# Patient Record
Sex: Female | Born: 1980 | Race: White | Hispanic: No | Marital: Married | Smoking: Former smoker
Health system: Southern US, Community
[De-identification: ages and names within clinical notes are randomized; demographics above are authoritative.]

## PROBLEM LIST (undated history)

## (undated) DIAGNOSIS — T8859XA Other complications of anesthesia, initial encounter: Secondary | ICD-10-CM

## (undated) DIAGNOSIS — N926 Irregular menstruation, unspecified: Secondary | ICD-10-CM

## (undated) DIAGNOSIS — Z9889 Other specified postprocedural states: Secondary | ICD-10-CM

## (undated) DIAGNOSIS — R112 Nausea with vomiting, unspecified: Secondary | ICD-10-CM

## (undated) DIAGNOSIS — T4145XA Adverse effect of unspecified anesthetic, initial encounter: Secondary | ICD-10-CM

## (undated) DIAGNOSIS — N6099 Unspecified benign mammary dysplasia of unspecified breast: Secondary | ICD-10-CM

## (undated) HISTORY — DX: Irregular menstruation, unspecified: N92.6

## (undated) HISTORY — DX: Unspecified benign mammary dysplasia of unspecified breast: N60.99

## (undated) HISTORY — PX: TUBAL LIGATION: SHX77

---

## 2007-03-21 ENCOUNTER — Ambulatory Visit: Payer: Self-pay | Admitting: Unknown Physician Specialty

## 2007-04-06 ENCOUNTER — Observation Stay: Payer: Self-pay

## 2007-04-19 ENCOUNTER — Observation Stay: Payer: Self-pay | Admitting: Obstetrics and Gynecology

## 2007-04-24 ENCOUNTER — Inpatient Hospital Stay: Payer: Self-pay | Admitting: Obstetrics and Gynecology

## 2007-05-01 ENCOUNTER — Inpatient Hospital Stay: Payer: Self-pay | Admitting: Obstetrics & Gynecology

## 2010-11-24 ENCOUNTER — Inpatient Hospital Stay: Payer: Self-pay | Admitting: Obstetrics and Gynecology

## 2017-03-26 ENCOUNTER — Ambulatory Visit (INDEPENDENT_AMBULATORY_CARE_PROVIDER_SITE_OTHER): Payer: 59 | Admitting: Certified Nurse Midwife

## 2017-03-26 ENCOUNTER — Encounter: Payer: Self-pay | Admitting: Certified Nurse Midwife

## 2017-03-26 VITALS — BP 116/74 | HR 76 | Ht 63.0 in | Wt 148.7 lb

## 2017-03-26 DIAGNOSIS — N939 Abnormal uterine and vaginal bleeding, unspecified: Secondary | ICD-10-CM | POA: Diagnosis not present

## 2017-03-26 DIAGNOSIS — N898 Other specified noninflammatory disorders of vagina: Secondary | ICD-10-CM

## 2017-03-26 DIAGNOSIS — N921 Excessive and frequent menstruation with irregular cycle: Secondary | ICD-10-CM | POA: Diagnosis not present

## 2017-03-26 MED ORDER — FLUCONAZOLE 150 MG PO TABS: 150.0000 mg | ORAL_TABLET | Freq: Once | ORAL | 0 refills | Status: AC

## 2017-03-26 NOTE — Patient Instructions (Addendum)
Abnormal Uterine Bleeding Abnormal uterine bleeding can affect women at various stages in life, including teenagers, women in their reproductive years, pregnant women, and women who have reached menopause. Several kinds of uterine bleeding are considered abnormal, including:  Bleeding or spotting between periods.  Bleeding after sexual intercourse.  Bleeding that is heavier or more than normal.  Periods that last longer than usual.  Bleeding after menopause.  Many cases of abnormal uterine bleeding are minor and simple to treat, while others are more serious. Any type of abnormal bleeding should be evaluated by your health care provider. Treatment will depend on the cause of the bleeding. Follow these instructions at home: Monitor your condition for any changes. The following actions may help to alleviate any discomfort you are experiencing:  Avoid the use of tampons and douches as directed by your health care provider.  Change your pads frequently.  You should get regular pelvic exams and Pap tests. Keep all follow-up appointments for diagnostic tests as directed by your health care provider. Contact a health care provider if:  Your bleeding lasts more than 1 week.  You feel dizzy at times. Get help right away if:  You pass out.  You are changing pads every 15 to 30 minutes.  You have abdominal pain.  You have a fever.  You become sweaty or weak.  You are passing large blood clots from the vagina.  You start to feel nauseous and vomit. This information is not intended to replace advice given to you by your health care provider. Make sure you discuss any questions you have with your health care provider. Document Released: 07/23/2005 Document Revised: 01/04/2016 Document Reviewed: 02/19/2013 Elsevier Interactive Patient Education  2017 [employer]. Levonorgestrel intrauterine device (IUD) What is this medicine? LEVONORGESTREL IUD (LEE voe nor jes trel) is a  contraceptive (birth control) device. The device is placed inside the uterus by a healthcare professional. It is used to prevent pregnancy. This device can also be used to treat heavy bleeding that occurs during your period. This medicine may be used for other purposes; ask your health care provider or pharmacist if you have questions. COMMON BRAND NAME(S): Cameron Ali What should I tell my health care provider before I take this medicine? They need to know if you have any of these conditions: -abnormal Pap smear -cancer of the breast, uterus, or cervix -diabetes -endometritis -genital or pelvic infection now or in the past -have more than one sexual partner or your partner has more than one partner -heart disease -history of an ectopic or tubal pregnancy -immune system problems -IUD in place -liver disease or tumor -problems with blood clots or take blood-thinners -seizures -use intravenous drugs -uterus of unusual shape -vaginal bleeding that has not been explained -an unusual or allergic reaction to levonorgestrel, other hormones, silicone, or polyethylene, medicines, foods, dyes, or preservatives -pregnant or trying to get pregnant -breast-feeding How should I use this medicine? This device is placed inside the uterus by a health care professional. Talk to your pediatrician regarding the use of this medicine in children. Special care may be needed. Overdosage: If you think you have taken too much of this medicine contact a poison control center or emergency room at once. NOTE: This medicine is only for you. Do not share this medicine with others. What if I miss a dose? This does not apply. Depending on the brand of device you have inserted, the device will need to be replaced every 3 to 5  years if you wish to continue using this type of birth control. What may interact with this medicine? Do not take this medicine with any of the following  medications: -amprenavir -bosentan -fosamprenavir This medicine may also interact with the following medications: -aprepitant -armodafinil -barbiturate medicines for inducing sleep or treating seizures -bexarotene -boceprevir -griseofulvin -medicines to treat seizures like carbamazepine, ethotoin, felbamate, oxcarbazepine, phenytoin, topiramate -modafinil -pioglitazone -rifabutin -rifampin -rifapentine -some medicines to treat HIV infection like atazanavir, efavirenz, indinavir, lopinavir, nelfinavir, tipranavir, ritonavir -St. John's wort -warfarin This list may not describe all possible interactions. Give your health care provider a list of all the medicines, herbs, non-prescription drugs, or dietary supplements you use. Also tell them if you smoke, drink alcohol, or use illegal drugs. Some items may interact with your medicine. What should I watch for while using this medicine? Visit your doctor or health care professional for regular check ups. See your doctor if you or your partner has sexual contact with others, becomes HIV positive, or gets a sexual transmitted disease. This product does not protect you against HIV infection (AIDS) or other sexually transmitted diseases. You can check the placement of the IUD yourself by reaching up to the top of your vagina with clean fingers to feel the threads. Do not pull on the threads. It is a good habit to check placement after each menstrual period. Call your doctor right away if you feel more of the IUD than just the threads or if you cannot feel the threads at all. The IUD may come out by itself. You may become pregnant if the device comes out. If you notice that the IUD has come out use a backup birth control method like condoms and call your health care provider. Using tampons will not change the position of the IUD and are okay to use during your period. This IUD can be safely scanned with magnetic resonance imaging (MRI) only under  specific conditions. Before you have an MRI, tell your healthcare provider that you have an IUD in place, and which type of IUD you have in place. What side effects may I notice from receiving this medicine? Side effects that you should report to your doctor or health care professional as soon as possible: -allergic reactions like skin rash, itching or hives, swelling of the face, lips, or tongue -fever, flu-like symptoms -genital sores -high blood pressure -no menstrual period for 6 weeks during use -pain, swelling, warmth in the leg -pelvic pain or tenderness -severe or sudden headache -signs of pregnancy -stomach cramping -sudden shortness of breath -trouble with balance, talking, or walking -unusual vaginal bleeding, discharge -yellowing of the eyes or skin Side effects that usually do not require medical attention (report to your doctor or health care professional if they continue or are bothersome): -acne -breast pain -change in sex drive or performance -changes in weight -cramping, dizziness, or faintness while the device is being inserted -headache -irregular menstrual bleeding within first 3 to 6 months of use -nausea This list may not describe all possible side effects. Call your doctor for medical advice about side effects. You may report side effects to FDA at 1-800-FDA-1088. Where should I keep my medicine? This does not apply. NOTE: This sheet is a summary. It may not cover all possible information. If you have questions about this medicine, talk to your doctor, pharmacist, or health care provider.  2018 Elsevier/Gold Standard (2016-05-04 14:14:56) Norethindrone tablets (contraception) What is this medicine? NORETHINDRONE (nor eth IN drone) is an oral  contraceptive. The product contains a female hormone known as a progestin. It is used to prevent pregnancy. This medicine may be used for other purposes; ask your health care provider or pharmacist if you have  questions. COMMON BRAND NAME(S): Camila, Deblitane 28-Day, Errin, Heather, Qulin, Jolivette, [place], Nor-QD, Nora-BE, Norlyroc, Ortho Micronor, [employer] 28-Day What should I tell my health care provider before I take this medicine? They need to know if you have any of these conditions: -blood vessel disease or blood clots -breast, cervical, or vaginal cancer -diabetes -heart disease -kidney disease -liver disease -mental depression -migraine -seizures -stroke -vaginal bleeding -an unusual or allergic reaction to norethindrone, other medicines, foods, dyes, or preservatives -pregnant or trying to get pregnant -breast-feeding How should I use this medicine? Take this medicine by mouth with a glass of water. You may take it with or without food. Follow the directions on the prescription label. Take this medicine at the same time each day and in the order directed on the package. Do not take your medicine more often than directed. Contact your pediatrician regarding the use of this medicine in children. Special care may be needed. This medicine has been used in female children who have started having menstrual periods. A patient package insert for the product will be given with each prescription and refill. Read this sheet carefully each time. The sheet may change frequently. Overdosage: If you think you have taken too much of this medicine contact a poison control center or emergency room at once. NOTE: This medicine is only for you. Do not share this medicine with others. What if I miss a dose? Try not to miss a dose. Every time you miss a dose or take a dose late your chance of pregnancy increases. When 1 pill is missed (even if only 3 hours late), take the missed pill as soon as possible and continue taking a pill each day at the regular time (use a back up method of birth control for the next 48 hours). If more than 1 dose is missed, use an additional birth control method for the rest of  your pill pack until menses occurs. Contact your health care professional if more than 1 dose has been missed. What may interact with this medicine? Do not take this medicine with any of the following medications: -amprenavir or fosamprenavir -bosentan This medicine may also interact with the following medications: -antibiotics or medicines for infections, especially rifampin, rifabutin, rifapentine, and griseofulvin, and possibly penicillins or tetracyclines -aprepitant -barbiturate medicines, such as phenobarbital -carbamazepine -felbamate -modafinil -oxcarbazepine -phenytoin -ritonavir or other medicines for HIV infection or AIDS -St. John's wort -topiramate This list may not describe all possible interactions. Give your health care provider a list of all the medicines, herbs, non-prescription drugs, or dietary supplements you use. Also tell them if you smoke, drink alcohol, or use illegal drugs. Some items may interact with your medicine. What should I watch for while using this medicine? Visit your doctor or health care professional for regular checks on your progress. You will need a regular breast and pelvic exam and Pap smear while on this medicine. Use an additional method of birth control during the first cycle that you take these tablets. If you have any reason to think you are pregnant, stop taking this medicine right away and contact your doctor or health care professional. If you are taking this medicine for hormone related problems, it may take several cycles of use to see improvement in your condition. This medicine  does not protect you against HIV infection (AIDS) or any other sexually transmitted diseases. What side effects may I notice from receiving this medicine? Side effects that you should report to your doctor or health care professional as soon as possible: -breast tenderness or discharge -pain in the abdomen, chest, groin or leg -severe headache -skin rash,  itching, or hives -sudden shortness of breath -unusually weak or tired -vision or speech problems -yellowing of skin or eyes Side effects that usually do not require medical attention (report to your doctor or health care professional if they continue or are bothersome): -changes in sexual desire -change in menstrual flow -facial hair growth -fluid retention and swelling -headache -irritability -nausea -weight gain or loss This list may not describe all possible side effects. Call your doctor for medical advice about side effects. You may report side effects to FDA at 1-800-FDA-1088. Where should I keep my medicine? Keep out of the reach of children. Store at room temperature between 15 and 30 degrees C (59 and 86 degrees F). Throw away any unused medicine after the expiration date. NOTE: This sheet is a summary. It may not cover all possible information. If you have questions about this medicine, talk to your doctor, pharmacist, or health care provider.  2018 Elsevier/Gold Standard (2012-04-11 16:41:35)  Medroxyprogesterone injection [Contraceptive] What is this medicine? MEDROXYPROGESTERONE (me DROX ee proe JES te rone) contraceptive injections prevent pregnancy. They provide effective birth control for 3 months. Depo-subQ Provera 104 is also used for treating pain related to endometriosis. This medicine may be used for other purposes; ask your health care provider or pharmacist if you have questions. COMMON BRAND NAME(S): Depo-Provera, Depo-subQ Provera 104 What should I tell my health care provider before I take this medicine? They need to know if you have any of these conditions: -frequently drink alcohol -asthma -blood vessel disease or a history of a blood clot in the lungs or legs -bone disease such as osteoporosis -breast cancer -diabetes -eating disorder (anorexia nervosa or bulimia) -high blood pressure -HIV infection or AIDS -kidney disease -liver disease -mental  depression -migraine -seizures (convulsions) -stroke -tobacco smoker -vaginal bleeding -an unusual or allergic reaction to medroxyprogesterone, other hormones, medicines, foods, dyes, or preservatives -pregnant or trying to get pregnant -breast-feeding How should I use this medicine? Depo-Provera Contraceptive injection is given into a muscle. Depo-subQ Provera 104 injection is given under the skin. These injections are given by a health care professional. You must not be pregnant before getting an injection. The injection is usually given during the first 5 days after the start of a menstrual period or 6 weeks after delivery of a baby. Talk to your pediatrician regarding the use of this medicine in children. Special care may be needed. These injections have been used in female children who have started having menstrual periods. Overdosage: If you think you have taken too much of this medicine contact a poison control center or emergency room at once. NOTE: This medicine is only for you. Do not share this medicine with others. What if I miss a dose? Try not to miss a dose. You must get an injection once every 3 months to maintain birth control. If you cannot keep an appointment, call and reschedule it. If you wait longer than 13 weeks between Depo-Provera contraceptive injections or longer than 14 weeks between Depo-subQ Provera 104 injections, you could get pregnant. Use another method for birth control if you miss your appointment. You may also need a pregnancy test before  receiving another injection. What may interact with this medicine? Do not take this medicine with any of the following medications: -bosentan This medicine may also interact with the following medications: -aminoglutethimide -antibiotics or medicines for infections, especially rifampin, rifabutin, rifapentine, and griseofulvin -aprepitant -barbiturate medicines such as phenobarbital or  primidone -bexarotene -carbamazepine -medicines for seizures like ethotoin, felbamate, oxcarbazepine, phenytoin, topiramate -modafinil -St. John's wort This list may not describe all possible interactions. Give your health care provider a list of all the medicines, herbs, non-prescription drugs, or dietary supplements you use. Also tell them if you smoke, drink alcohol, or use illegal drugs. Some items may interact with your medicine. What should I watch for while using this medicine? This drug does not protect you against HIV infection (AIDS) or other sexually transmitted diseases. Use of this product may cause you to lose calcium from your bones. Loss of calcium may cause weak bones (osteoporosis). Only use this product for more than 2 years if other forms of birth control are not right for you. The longer you use this product for birth control the more likely you will be at risk for weak bones. Ask your health care professional how you can keep strong bones. You may have a change in bleeding pattern or irregular periods. Many females stop having periods while taking this drug. If you have received your injections on time, your chance of being pregnant is very low. If you think you may be pregnant, see your health care professional as soon as possible. Tell your health care professional if you want to get pregnant within the next year. The effect of this medicine may last a long time after you get your last injection. What side effects may I notice from receiving this medicine? Side effects that you should report to your doctor or health care professional as soon as possible: -allergic reactions like skin rash, itching or hives, swelling of the face, lips, or tongue -breast tenderness or discharge -breathing problems -changes in vision -depression -feeling faint or lightheaded, falls -fever -pain in the abdomen, chest, groin, or leg -problems with balance, talking, walking -unusually weak  or tired -yellowing of the eyes or skin Side effects that usually do not require medical attention (report to your doctor or health care professional if they continue or are bothersome): -acne -fluid retention and swelling -headache -irregular periods, spotting, or absent periods -temporary pain, itching, or skin reaction at site where injected -weight gain This list may not describe all possible side effects. Call your doctor for medical advice about side effects. You may report side effects to FDA at 1-800-FDA-1088. Where should I keep my medicine? This does not apply. The injection will be given to you by a health care professional. NOTE: This sheet is a summary. It may not cover all possible information. If you have questions about this medicine, talk to your doctor, pharmacist, or health care provider.  2018 Elsevier/Gold Standard (2008-08-13 18:37:56) Fluconazole tablets What is this medicine? FLUCONAZOLE (floo KON na zole) is an antifungal medicine. It is used to treat certain kinds of fungal or yeast infections. This medicine may be used for other purposes; ask your health care provider or pharmacist if you have questions. COMMON BRAND NAME(S): Diflucan What should I tell my health care provider before I take this medicine? They need to know if you have any of these conditions: -history of irregular heart beat -kidney disease -an unusual or allergic reaction to fluconazole, other azole antifungals, medicines, foods, dyes, or preservatives -  pregnant or trying to get pregnant -breast-feeding How should I use this medicine? Take this medicine by mouth. Follow the directions on the prescription label. Do not take your medicine more often than directed. Talk to your pediatrician regarding the use of this medicine in children. Special care may be needed. This medicine has been used in children as young as 21 months of age. Overdosage: If you think you have taken too much of this  medicine contact a poison control center or emergency room at once. NOTE: This medicine is only for you. Do not share this medicine with others. What if I miss a dose? If you miss a dose, take it as soon as you can. If it is almost time for your next dose, take only that dose. Do not take double or extra doses. What may interact with this medicine? Do not take this medicine with any of the following medications: -astemizole -certain medicines for irregular heart beat like dofetilide, dronedarone, quinidine -cisapride -erythromycin -lomitapide -other medicines that prolong the QT interval (cause an abnormal heart rhythm) -pimozide -terfenadine -thioridazine -tolvaptan -ziprasidone This medicine may also interact with the following medications: -antiviral medicines for HIV or AIDS -birth control pills -certain antibiotics like rifabutin, rifampin -certain medicines for blood pressure like amlodipine, isradipine, felodipine, hydrochlorothiazide, losartan, nifedipine -certain medicines for cancer like cyclophosphamide, vinblastine, vincristine -certain medicines for cholesterol like atorvastatin, lovastatin, fluvastatin, simvastatin -certain medicines for depression, anxiety, or psychotic disturbances like amitriptyline, midazolam, nortriptyline, triazolam -certain medicines for diabetes like glipizide, glyburide, tolbutamide -certain medicines for pain like alfentanil, fentanyl, methadone -certain medicines for seizures like carbamazepine, phenytoin -certain medicines that treat or prevent blood clots like warfarin -halofantrine -medicines that lower your chance of fighting infection like cyclosporine, prednisone, tacrolimus -NSAIDS, medicines for pain and inflammation, like celecoxib, diclofenac, flurbiprofen, ibuprofen, meloxicam, naproxen -other medicines for fungal infections -sirolimus -theophylline -tofacitinib This list may not describe all possible interactions. Give your  health care provider a list of all the medicines, herbs, non-prescription drugs, or dietary supplements you use. Also tell them if you smoke, drink alcohol, or use illegal drugs. Some items may interact with your medicine. What should I watch for while using this medicine? Visit your doctor or health care professional for regular checkups. If you are taking this medicine for a long time you may need blood work. Tell your doctor if your symptoms do not improve. Some fungal infections need many weeks or months of treatment to cure. Alcohol can increase possible damage to your liver. Avoid alcoholic drinks. If you have a vaginal infection, do not have sex until you have finished your treatment. You can wear a sanitary napkin. Do not use tampons. Wear freshly washed cotton, not synthetic, panties. What side effects may I notice from receiving this medicine? Side effects that you should report to your doctor or health care professional as soon as possible: -allergic reactions like skin rash or itching, hives, swelling of the lips, mouth, tongue, or throat -dark urine -feeling dizzy or faint -irregular heartbeat or chest pain -redness, blistering, peeling or loosening of the skin, including inside the mouth -trouble breathing -unusual bruising or bleeding -vomiting -yellowing of the eyes or skin Side effects that usually do not require medical attention (report to your doctor or health care professional if they continue or are bothersome): -changes in how food tastes -diarrhea -headache -stomach upset or nausea This list may not describe all possible side effects. Call your doctor for medical advice about side effects. You may report  side effects to FDA at 1-800-FDA-1088. Where should I keep my medicine? Keep out of the reach of children. Store at room temperature below 30 degrees C (86 degrees F). Throw away any medicine after the expiration date. NOTE: This sheet is a summary. It may not cover  all possible information. If you have questions about this medicine, talk to your doctor, pharmacist, or health care provider.  2018 Elsevier/Gold Standard (2013-02-28 19:37:38)  Intrauterine Device Insertion An intrauterine device (IUD) is a medical device that gets inserted into the uterus to prevent pregnancy. It is a small, T-shaped device that has one or two nylon strings hanging down from it. The strings hang out of the lower part of the uterus (cervix) to allow for future IUD removal. There are two types of IUDs available:  Copper IUD. This type of IUD has copper wire wrapped around it. Copper makes the uterus and fallopian tubes produce a fluid that kills sperm. A copper IUD may last up to 10 years.  Hormone IUD. This type of IUD is made of plastic and contains the hormone progestin (synthetic progesterone). The hormone thickens mucus in the cervix and prevents sperm from entering the uterus. It also thins the uterine lining to prevent implantation of a fertilized egg. The hormone can weaken or kill the sperm that get into the uterus. A hormone IUD may last 3-5 years.  Tell a health care provider about:  Any allergies you have.  All medicines you are taking, including vitamins, herbs, eye drops, creams, and over-the-counter medicines.  Any problems you or family members have had with anesthetic medicines.  Any blood disorders you have.  Any surgeries you have had.  Any medical conditions you have, including any STIs (sexually transmitted infections) you may have.  Whether you are pregnant or may be pregnant. What are the risks? Generally, this is a safe procedure. However, problems may occur, including:  Infection.  Bleeding.  Allergic reactions to medicines.  Accidental puncture (perforation) of the uterus, or damage to other structures or organs.  Accidental placement of the IUD either in the muscle layer of the uterus (myometrium) or outside the uterus.  The IUD  falling out of the uterus (expulsion). This is more common among women who have recently had a child.  Pregnancy that happens in the fallopian tube (ectopic pregnancy).  Infection of the uterus and fallopian tubes (pelvic inflammatory disease).  What happens before the procedure?  Schedule the IUD insertion for when you will have your menstrual period or right after, to make sure you are not pregnant. Placement of the IUD is better tolerated shortly after a menstrual cycle.  Follow instructions from your health care provider about eating or drinking restrictions.  Ask your health care provider about changing or stopping your regular medicines. This is especially important if you are taking diabetes medicines or blood thinners.  You may get a pain reliever to take before the procedure.  You may have tests for: ? Pregnancy. A pregnancy test involves having a urine sample taken. ? STIs. Placing an IUD in someone who has an STI can make the infection worse. ? Cervical cancer. You may have a Pap test to check for this type of cancer. This means collecting cells from your cervix to be examined under a microscope.  You may have a physical exam to determine the size and position of your uterus. The procedure may vary among health care providers and hospitals. What happens during the procedure?  A tool (  speculum) will be placed in your vagina and widened so that your health care provider can see your cervix.  Medicine may be applied to your cervix to help lower your risk of infection (antiseptic medicine).  You may be given an anesthetic medicine to numb each side of your cervix (intracervical block or paracervical block). This medicine is usually given by an injection into the cervix.  A tool (uterine sound) will be inserted into your uterus to determine the length of your uterus and the direction that your uterus may be tilted.  A slim instrument or tube (IUD inserter) that holds the IUD  will be inserted into your vagina, through your cervical canal, and into your uterus.  The IUD will be placed in the uterus, and the IUD inserter will be removed.  The strings that are attached to the IUD will be trimmed so that they lie just below the cervix. The procedure may vary among health care providers and hospitals. What happens after the procedure?  You may have bleeding after the procedure. This is normal. It varies from light bleeding (spotting) for a few days to menstrual-like bleeding.  You may have cramping and pain.  You may feel dizzy or light-headed.  You may have lower back pain. Summary  An intrauterine device (IUD) is a small, T-shaped device that has one or two nylon strings hanging down from it.  Two types of IUDs are available. You may have a copper IUD or a hormone IUD.  Schedule the IUD insertion for when you will have your menstrual period or right after, to make sure you are not pregnant. Placement of the IUD is better tolerated shortly after a menstrual cycle.  You may have bleeding after the procedure. This is normal. It varies from light spotting for a few days to menstrual-like bleeding. This information is not intended to replace advice given to you by your health care provider. Make sure you discuss any questions you have with your health care provider. Document Released: 03/21/2011 Document Revised: 06/13/2016 Document Reviewed: 06/13/2016 Elsevier Interactive Patient Education  2017 [employer].

## 2017-03-29 LAB — CANDIDA 6 SPECIES PROFILE, NAA
CANDIDA KRUSEI, NAA: NEGATIVE
Candida albicans, NAA: NEGATIVE
Candida glabrata, NAA: NEGATIVE
Candida lusitaniae, NAA: NEGATIVE
Candida parapsilosis, NAA: NEGATIVE
Candida tropicalis, NAA: NEGATIVE

## 2017-03-29 LAB — BACTERIAL VAGINOSIS, NAA

## 2017-03-31 NOTE — Progress Notes (Signed)
GYN ENCOUNTER NOTE  Subjective:       Maria Nixon is a 36 y.o. G53P2012 female here for gynecologic evaluation of irregular menses and recurrent yeast infections.   States her period is "ridiculous". Last for seven (7) full days then dark bleeding with clots for the following week.   Last annual exam with [place] and lab work in October 2017 at [facility].   Denies difficulty breathing or respiratory distress, chest pain, abdominal pain, dysuria, and leg pain or swelling.    Gynecologic History  Patient's last menstrual period was 03/03/2017 (exact date).  Contraception: none  Last Pap: 2016. Results were: normal  Menstrual History  Period Duration (Days): seven (7) to 14 Period Pattern: (!) Irregular Menstrual Flow: Heavy Menstrual Control: Maxi pad, Tampon Menstrual Control Change Freq (Hours): three (3) Dysmenorrhea: (!) Mild Dysmenorrhea Symptoms: Cramping  Obstetric History  OB History  Gravida Para Term Preterm AB Living  3 2 2   1 2   SAB TAB Ectopic Multiple Live Births  1       2    # Outcome Date GA Lbr Len/2nd Weight Sex Delivery Anes PTL Lv  3 Term 11/24/10 [redacted]w[redacted]d 9 lb 8 oz (4.309 kg) M CS-Unspec   LIV  2 Term 04/25/07 [id number] lb 1.9 oz (4.137 kg) F Vag-Spont   LIV  1 SAB 2001        ND      Past Medical History:  Diagnosis Date  . Irregular periods     Past Surgical History:  Procedure Laterality Date  . NONE      No current outpatient prescriptions on file prior to visit.   No current facility-administered medications on file prior to visit.     Not on File  Social History   Social History  . Marital status: Married    Spouse name: N/A  . Number of children: N/A  . Years of education: N/A   Occupational History  . Not on file.   Social History Main Topics  . Smoking status: Former SmResearch scientist (life sciences). Smokeless tobacco: Never Used  . Alcohol use Yes     Comment: SOCIAL  . Drug use: No  . Sexual activity: Yes    Partners: Male   Birth control/ protection: None   Other Topics Concern  . Not on file   Social History Narrative  . No narrative on file    Family History  Problem Relation Age of Onset  . Rashes / Skin problems Mother   . Diabetes Maternal Grandmother   . Heart failure Maternal Grandfather   . Stroke Paternal Grandmother     The following portions of the patient's history were reviewed and updated as appropriate: allergies, current medications, past family history, past medical history, past social history, past surgical history and problem list.  Review of Systems  Review of Systems - Negative except as noted above.  History obtained from the patient.   Objective:   BP 116/74   Pulse 76   Ht 5' 3"  (1.6 m)   Wt 148 lb 11.2 oz (67.4 kg)   LMP 03/03/2017 (Exact Date)   BMI 26.34 kg/m   CONSTITUTIONAL: Well-developed, well-nourished female in no acute distress.   HENT:  Normocephalic, atraumatic.   NECK: Normal range of motion, supple, no masses.     SKIN: Skin is warm and dry. No rash noted. Not diaphoretic. No erythema. No pallor.  NELincolnAlert and oriented to person, place, and time.  PSYCHIATRIC: Normal mood and affect. Normal behavior. Normal judgment and thought content.  CARDIOVASCULAR:Not Examined  RESPIRATORY: Not Examined  BREASTS: Not Examined  ABDOMEN: Soft, non distended; Non tender.  No Organomegaly.  PELVIC:  External Genitalia: Normal  Vagina: Normal  Cervix: Normal  Uterus: Normal size, shape,consistency, mobile  Adnexa: Normal   MUSCULOSKELETAL: Normal range of motion. No tenderness.  No cyanosis, clubbing, or edema.  Assessment:   1. Abnormal uterine bleeding  - Candida 6 Species Profile, NAA - Bacterial Vaginosis, NAA  2. Menorrhagia with irregular cycle  3. Vaginal discharge  - Candida 6 Species Profile, NAA - Bacterial Vaginosis, NAA   Plan:   NuSwab collected. Will contact patient via MyChart with results.   Encouraged the use  of coconut oil, lavender oil, and tree tree oil to help with vaginal irritation.   Discussed use of progesterone only agents to regulate menses.   Will sign MMR for records from [facility].   Reviewed red flag symptoms and when to call.   RTC as needed.    Diona Fanti, CNM

## 2017-04-01 ENCOUNTER — Encounter: Payer: Self-pay | Admitting: Certified Nurse Midwife

## 2017-04-05 ENCOUNTER — Telehealth: Payer: Self-pay | Admitting: Certified Nurse Midwife

## 2017-04-05 DIAGNOSIS — N761 Subacute and chronic vaginitis: Secondary | ICD-10-CM

## 2017-04-05 MED ORDER — NYSTATIN-TRIAMCINOLONE 100000-0.1 UNIT/GM-% EX CREA: 1.0000 "application " | TOPICAL_CREAM | Freq: Two times a day (BID) | CUTANEOUS | 0 refills | Status: DC

## 2017-04-05 NOTE — Telephone Encounter (Signed)
Patient is still having itching and irritation, pressure and urgency she finished the 2 doses of medication and also started her period - she is feeling miserable   Is there anything you can do before this long weekend??   Please callok

## 2017-04-05 NOTE — Telephone Encounter (Signed)
Pt calls and states that she is still having vaginal irritation. Pt has completed diflucan treatment as well as tea tree oil. However is still irritated. Per JML sent rx for mycolog pt have verbal understanding.

## 2017-06-13 ENCOUNTER — Encounter: Payer: Self-pay | Admitting: Certified Nurse Midwife

## 2017-06-13 ENCOUNTER — Other Ambulatory Visit: Payer: Self-pay

## 2017-06-13 ENCOUNTER — Ambulatory Visit: Payer: 59 | Admitting: Certified Nurse Midwife

## 2017-06-13 VITALS — BP 103/75 | HR 83 | Wt 150.9 lb

## 2017-06-13 DIAGNOSIS — N898 Other specified noninflammatory disorders of vagina: Secondary | ICD-10-CM | POA: Diagnosis not present

## 2017-06-13 DIAGNOSIS — N939 Abnormal uterine and vaginal bleeding, unspecified: Secondary | ICD-10-CM

## 2017-06-13 MED ORDER — METRONIDAZOLE 500 MG PO TABS: 500.0000 mg | ORAL_TABLET | Freq: Two times a day (BID) | ORAL | 0 refills | Status: AC

## 2017-06-13 MED ORDER — FLUCONAZOLE 150 MG PO TABS: 150.0000 mg | ORAL_TABLET | Freq: Once | ORAL | 0 refills | Status: AC

## 2017-06-13 MED ORDER — SECNIDAZOLE 2 G PO PACK: 1.0000 | PACK | Freq: Once | ORAL | 0 refills | Status: AC

## 2017-06-13 NOTE — Patient Instructions (Addendum)
Endometrial Ablation Endometrial ablation is a procedure that destroys the thin inner layer of the lining of the uterus (endometrium). This procedure may be done:  To stop heavy periods.  To stop bleeding that is causing anemia.  To control irregular bleeding.  To treat bleeding caused by small tumors (fibroids) in the endometrium.  This procedure is often an alternative to major surgery, such as removal of the uterus and cervix (hysterectomy). As a result of this procedure:  You may not be able to have children. However, if you are premenopausal (you have not gone through menopause): ? You may still have a small chance of getting pregnant. ? You will need to use a reliable method of birth control after the procedure to prevent pregnancy.  You may stop having a menstrual period, or you may have only a small amount of bleeding during your period. Menstruation may return several years after the procedure.  Tell a health care provider about:  Any allergies you have.  All medicines you are taking, including vitamins, herbs, eye drops, creams, and over-the-counter medicines.  Any problems you or family members have had with the use of anesthetic medicines.  Any blood disorders you have.  Any surgeries you have had.  Any medical conditions you have. What are the risks? Generally, this is a safe procedure. However, problems may occur, including:  A hole (perforation) in the uterus or bowel.  Infection of the uterus, bladder, or vagina.  Bleeding.  Damage to other structures or organs.  An air bubble in the lung (air embolus).  Problems with pregnancy after the procedure.  Failure of the procedure.  Decreased ability to diagnose cancer in the endometrium.  What happens before the procedure?  You will have tests of your endometrium to make sure there are no pre-cancerous cells or cancer cells present.  You may have an ultrasound of the uterus.  You may be given  medicines to thin the endometrium.  Ask your health care provider about: ? Changing or stopping your regular medicines. This is especially important if you take diabetes medicines or blood thinners. ? Taking medicines such as aspirin and ibuprofen. These medicines can thin your blood. Do not take these medicines before your procedure if your doctor tells you not to.  Plan to have someone take you home from the hospital or clinic. What happens during the procedure?  You will lie on an exam table with your feet and legs supported as in a pelvic exam.  To lower your risk of infection: ? Your health care team will wash or sanitize their hands and put on germ-free (sterile) gloves. ? Your genital area will be washed with soap.  An IV tube will be inserted into one of your veins.  You will be given a medicine to help you relax (sedative).  A surgical instrument with a light and camera (resectoscope) will be inserted into your vagina and moved into your uterus. This allows your surgeon to see inside your uterus.  Endometrial tissue will be removed using one of the following methods: ? Radiofrequency. This method uses a radiofrequency-alternating electric current to remove the endometrium. ? Cryotherapy. This method uses extreme cold to freeze the endometrium. ? Heated-free liquid. This method uses a heated saltwater (saline) solution to remove the endometrium. ? Microwave. This method uses high-energy microwaves to heat up the endometrium and remove it. ? Thermal balloon. This method involves inserting a catheter with a balloon tip into the uterus. The balloon tip is   filled with heated fluid to remove the endometrium. The procedure may vary among health care providers and hospitals. What happens after the procedure?  Your blood pressure, heart rate, breathing rate, and blood oxygen level will be monitored until the medicines you were given have worn off.  As tissue healing occurs, you may  notice vaginal bleeding for 4-6 weeks after the procedure. You may also experience: ? Cramps. ? Thin, watery vaginal discharge that is light pink or brown in color. ? A need to urinate more frequently than usual. ? Nausea.  Do not drive for 24 hours if you were given a sedative.  Do not have sex or insert anything into your vagina until your health care provider approves. Summary  Endometrial ablation is done to treat the many causes of heavy menstrual bleeding.  The procedure may be done only after medications have been tried to control the bleeding.  Plan to have someone take you home from the hospital or clinic. This information is not intended to replace advice given to you by your health care provider. Make sure you discuss any questions you have with your health care provider. Document Released: 06/01/2004 Document Revised: 08/09/2016 Document Reviewed: 08/09/2016 Elsevier Interactive Patient Education  2017 [employer] Secnidazole oral granules What is this medicine? SECNIDAZOLE (sek NID a zole) is an antiinfective. It is used to treat certain kinds of bacterial infections. It will not work for colds, flu, or other viral infections. This medicine may be used for other purposes; ask your health care provider or pharmacist if you have questions. COMMON BRAND NAME(S): SOLOSEC What should I tell my health care provider before I take this medicine? They need to know if you have any of these conditions: -an unusual or allergic reaction to secnidazole, other medicines, foods, dyes, or preservatives -pregnant or trying to get pregnant -breast-feeding How should I use this medicine? Take this medicine by mouth. Follow the directions on the prescription label. Do not crush or chew this medicine. You can take it with or without food. If it upsets your stomach, take it with food. Take all of your medicine as directed. Talk to your pediatrician regarding the use of this medicine in  children. Special care may be needed. Overdosage: If you think you have taken too much of this medicine contact a poison control center or emergency room at once. NOTE: This medicine is only for you. Do not share this medicine with others. What if I miss a dose? This does not apply; this medicine is not for regular use. What may interact with this medicine? Interactions are not expected. This list may not describe all possible interactions. Give your health care provider a list of all the medicines, herbs, non-prescription drugs, or dietary supplements you use. Also tell them if you smoke, drink alcohol, or use illegal drugs. Some items may interact with your medicine. What should I watch for while using this medicine? Tell your doctor or healthcare professional if your symptoms do not start to get better or if they get worse. What side effects may I notice from receiving this medicine? Side effects that you should report to your doctor or health care professional as soon as possible: -allergic reactions like skin rash, itching or hives, swelling of the face, lips, or tongue Side effects that usually do not require medical attention (report these to your doctor or health care professional if they continue or are bothersome): -changes in taste -diarrhea -headache -nausea, vomiting -stomach pain -vaginal discharge, itching,  or odor in women This list may not describe all possible side effects. Call your doctor for medical advice about side effects. You may report side effects to FDA at 1-800-FDA-1088. Where should I keep my medicine? Keep out of the reach of children. Store at room temperature between 15 and 30 degrees C (59 and 86 degrees F). Throw away any unused medicine after the expiration date. NOTE: This sheet is a summary. It may not cover all possible information. If you have questions about this medicine, talk to your doctor, pharmacist, or health care provider.  2018 Elsevier/Gold  Standard (2016-04-24 12:30:04) Metronidazole tablets or capsules What is this medicine? METRONIDAZOLE (me troe NI da zole) is an antiinfective. It is used to treat certain kinds of bacterial and protozoal infections. It will not work for colds, flu, or other viral infections. This medicine may be used for other purposes; ask your health care provider or pharmacist if you have questions. COMMON BRAND NAME(S): Flagyl What should I tell my health care provider before I take this medicine? They need to know if you have any of these conditions: -anemia or other blood disorders -disease of the nervous system -fungal or yeast infection -if you drink alcohol containing drinks -liver disease -seizures -an unusual or allergic reaction to metronidazole, or other medicines, foods, dyes, or preservatives -pregnant or trying to get pregnant -breast-feeding How should I use this medicine? Take this medicine by mouth with a full glass of water. Follow the directions on the prescription label. Take your medicine at regular intervals. Do not take your medicine more often than directed. Take all of your medicine as directed even if you think you are better. Do not skip doses or stop your medicine early. Talk to your pediatrician regarding the use of this medicine in children. Special care may be needed. Overdosage: If you think you have taken too much of this medicine contact a poison control center or emergency room at once. NOTE: This medicine is only for you. Do not share this medicine with others. What if I miss a dose? If you miss a dose, take it as soon as you can. If it is almost time for your next dose, take only that dose. Do not take double or extra doses. What may interact with this medicine? Do not take this medicine with any of the following medications: -alcohol or any product that contains alcohol -amprenavir oral solution -cisapride -disulfiram -dofetilide -dronedarone -paclitaxel  injection -pimozide -ritonavir oral solution -sertraline oral solution -sulfamethoxazole-trimethoprim injection -thioridazine -ziprasidone This medicine may also interact with the following medications: -birth control pills -cimetidine -lithium -other medicines that prolong the QT interval (cause an abnormal heart rhythm) -phenobarbital -phenytoin -warfarin This list may not describe all possible interactions. Give your health care provider a list of all the medicines, herbs, non-prescription drugs, or dietary supplements you use. Also tell them if you smoke, drink alcohol, or use illegal drugs. Some items may interact with your medicine. What should I watch for while using this medicine? Tell your doctor or health care professional if your symptoms do not improve or if they get worse. You may get drowsy or dizzy. Do not drive, use machinery, or do anything that needs mental alertness until you know how this medicine affects you. Do not stand or sit up quickly, especially if you are an older patient. This reduces the risk of dizzy or fainting spells. Avoid alcoholic drinks while you are taking this medicine and for three days afterward. Alcohol may  make you feel dizzy, sick, or flushed. If you are being treated for a sexually transmitted disease, avoid sexual contact until you have finished your treatment. Your sexual partner may also need treatment. What side effects may I notice from receiving this medicine? Side effects that you should report to your doctor or health care professional as soon as possible: -allergic reactions like skin rash or hives, swelling of the face, lips, or tongue -confusion, clumsiness -difficulty speaking -discolored or sore mouth -dizziness -fever, infection -numbness, tingling, pain or weakness in the hands or feet -trouble passing urine or change in the amount of urine -redness, blistering, peeling or loosening of the skin, including inside the  mouth -seizures -unusually weak or tired -vaginal irritation, dryness, or discharge Side effects that usually do not require medical attention (report to your doctor or health care professional if they continue or are bothersome): -diarrhea -headache -irritability -metallic taste -nausea -stomach pain or cramps -trouble sleeping This list may not describe all possible side effects. Call your doctor for medical advice about side effects. You may report side effects to FDA at 1-800-FDA-1088. Where should I keep my medicine? Keep out of the reach of children. Store at room temperature below 25 degrees C (77 degrees F). Protect from light. Keep container tightly closed. Throw away any unused medicine after the expiration date. NOTE: This sheet is a summary. It may not cover all possible information. If you have questions about this medicine, talk to your doctor, pharmacist, or health care provider.  2018 Elsevier/Gold Standard (2013-02-27 14:08:39) Fluconazole tablets What is this medicine? FLUCONAZOLE (floo KON na zole) is an antifungal medicine. It is used to treat certain kinds of fungal or yeast infections. This medicine may be used for other purposes; ask your health care provider or pharmacist if you have questions. COMMON BRAND NAME(S): Diflucan What should I tell my health care provider before I take this medicine? They need to know if you have any of these conditions: -history of irregular heart beat -kidney disease -an unusual or allergic reaction to fluconazole, other azole antifungals, medicines, foods, dyes, or preservatives -pregnant or trying to get pregnant -breast-feeding How should I use this medicine? Take this medicine by mouth. Follow the directions on the prescription label. Do not take your medicine more often than directed. Talk to your pediatrician regarding the use of this medicine in children. Special care may be needed. This medicine has been used in children  as young as 606 months of age. Overdosage: If you think you have taken too much of this medicine contact a poison control center or emergency room at once. NOTE: This medicine is only for you. Do not share this medicine with others. What if I miss a dose? If you miss a dose, take it as soon as you can. If it is almost time for your next dose, take only that dose. Do not take double or extra doses. What may interact with this medicine? Do not take this medicine with any of the following medications: -astemizole -certain medicines for irregular heart beat like dofetilide, dronedarone, quinidine -cisapride -erythromycin -lomitapide -other medicines that prolong the QT interval (cause an abnormal heart rhythm) -pimozide -terfenadine -thioridazine -tolvaptan -ziprasidone This medicine may also interact with the following medications: -antiviral medicines for HIV or AIDS -birth control pills -certain antibiotics like rifabutin, rifampin -certain medicines for blood pressure like amlodipine, isradipine, felodipine, hydrochlorothiazide, losartan, nifedipine -certain medicines for cancer like cyclophosphamide, vinblastine, vincristine -certain medicines for cholesterol like atorvastatin, lovastatin, fluvastatin,  simvastatin -certain medicines for depression, anxiety, or psychotic disturbances like amitriptyline, midazolam, nortriptyline, triazolam -certain medicines for diabetes like glipizide, glyburide, tolbutamide -certain medicines for pain like alfentanil, fentanyl, methadone -certain medicines for seizures like carbamazepine, phenytoin -certain medicines that treat or prevent blood clots like warfarin -halofantrine -medicines that lower your chance of fighting infection like cyclosporine, prednisone, tacrolimus -NSAIDS, medicines for pain and inflammation, like celecoxib, diclofenac, flurbiprofen, ibuprofen, meloxicam, naproxen -other medicines for fungal  infections -sirolimus -theophylline -tofacitinib This list may not describe all possible interactions. Give your health care provider a list of all the medicines, herbs, non-prescription drugs, or dietary supplements you use. Also tell them if you smoke, drink alcohol, or use illegal drugs. Some items may interact with your medicine. What should I watch for while using this medicine? Visit your doctor or health care professional for regular checkups. If you are taking this medicine for a long time you may need blood work. Tell your doctor if your symptoms do not improve. Some fungal infections need many weeks or months of treatment to cure. Alcohol can increase possible damage to your liver. Avoid alcoholic drinks. If you have a vaginal infection, do not have sex until you have finished your treatment. You can wear a sanitary napkin. Do not use tampons. Wear freshly washed cotton, not synthetic, panties. What side effects may I notice from receiving this medicine? Side effects that you should report to your doctor or health care professional as soon as possible: -allergic reactions like skin rash or itching, hives, swelling of the lips, mouth, tongue, or throat -dark urine -feeling dizzy or faint -irregular heartbeat or chest pain -redness, blistering, peeling or loosening of the skin, including inside the mouth -trouble breathing -unusual bruising or bleeding -vomiting -yellowing of the eyes or skin Side effects that usually do not require medical attention (report to your doctor or health care professional if they continue or are bothersome): -changes in how food tastes -diarrhea -headache -stomach upset or nausea This list may not describe all possible side effects. Call your doctor for medical advice about side effects. You may report side effects to FDA at 1-800-FDA-1088. Where should I keep my medicine? Keep out of the reach of children. Store at room temperature below 30 degrees C  (86 degrees F). Throw away any medicine after the expiration date. NOTE: This sheet is a summary. It may not cover all possible information. If you have questions about this medicine, talk to your doctor, pharmacist, or health care provider.  2018 Elsevier/Gold Standard (2013-02-28 19:37:38)

## 2017-06-14 ENCOUNTER — Encounter: Payer: Self-pay | Admitting: Certified Nurse Midwife

## 2017-06-14 NOTE — Progress Notes (Signed)
GYN ENCOUNTER NOTE  Subjective:       Maria Nixon is a 36 y.o. [id number] female here for gynecologic evaluation of the following issues:  1. Abnormal uterine bleeding 2. Bloody vaginal discharge  Reports persistent bloody vaginal discharge since last menses. Notices increased cramping after period, vaginal odor during period, and increased vaginal discharge when wiping after urination   History significant for history of DVT with OCP usage as teenager.   Denies difficulty breathing or respiratory distress, chest pain, abdominal pain, excessive vaginal bleeding, dysuria, and leg pain or swelling.   Gynecologic History  Period Duration (Days): seven (7) to 14 Period Pattern: (!) Irregular Menstrual Flow: Heavy Menstrual Control: Maxi pad, Tampon Menstrual Control Change Freq (Hours): three (3) Dysmenorrhea: (!) Mild Dysmenorrhea Symptoms: Cramping  Patient's last menstrual period was 05/31/2017 (exact date).  Contraception: tubal ligation  Last Pap: 2016. Results were: normal  Obstetric History  OB History  Gravida Para Term Preterm AB Living  3 2 2   1 2   SAB TAB Ectopic Multiple Live Births  1       2    # Outcome Date GA Lbr Len/2nd Weight Sex Delivery Anes PTL Lv  3 Term 11/24/10 5870w0d  9 lb 8 oz (4.309 kg) M CS-Unspec   LIV  2 Term 04/25/07 2115w0d  9 lb 1.9 oz (4.137 kg) F Vag-Spont   LIV  1 SAB 2001        ND      Past Medical History:  Diagnosis Date  . Irregular periods     Past Surgical History:  Procedure Laterality Date  . NONE      Current Outpatient Medications on File Prior to Visit  Medication Sig Dispense Refill  . Melatonin 10 MG CAPS Take by mouth.    . phentermine (ADIPEX-P) 37.5 MG tablet Take 37.5 mg daily before breakfast by mouth.     No current facility-administered medications on file prior to visit.     No Known Allergies  Social History   Socioeconomic History  . Marital status: Married    Spouse name: Not on file  .  Number of children: Not on file  . Years of education: Not on file  . Highest education level: Not on file  Social Needs  . Financial resource strain: Not on file  . Food insecurity - worry: Not on file  . Food insecurity - inability: Not on file  . Transportation needs - medical: Not on file  . Transportation needs - non-medical: Not on file  Occupational History  . Not on file  Tobacco Use  . Smoking status: Former Games developermoker  . Smokeless tobacco: Never Used  Substance and Sexual Activity  . Alcohol use: Yes    Comment: SOCIAL  . Drug use: No  . Sexual activity: Yes    Partners: Male    Birth control/protection: None  Other Topics Concern  . Not on file  Social History Narrative  . Not on file    Family History  Problem Relation Age of Onset  . Rashes / Skin problems Mother   . Diabetes Maternal Grandmother   . Heart failure Maternal Grandfather   . Stroke Paternal Grandmother     The following portions of the patient's history were reviewed and updated as appropriate: allergies, current medications, past family history, past medical history, past social history, past surgical history and problem list.  Review of Systems  Review of Systems - Negative except as noted above.  History obtained from the patient.    Objective:   BP 103/75   Pulse 83   Wt 150 lb 14.4 oz (68.4 kg)   BMI 26.73 kg/m    CONSTITUTIONAL: Well-developed, well-nourished female in no acute distress.   HENT:  Normocephalic, atraumatic.   NECK: Normal range of motion, supple, no masses.     SKIN: Skin is warm and dry. No rash noted. Not diaphoretic. No erythema. No pallor.  NEUROLGIC: Alert and oriented to person, place, and time.   PSYCHIATRIC: Normal mood and affect. Normal behavior. Normal judgment and thought content.  CARDIOVASCULAR:Not Examined  RESPIRATORY: Not Examined  BREASTS: Not Examined  ABDOMEN: Soft, non distended; Non tender.  No Organomegaly.  PELVIC:  External  Genitalia: Normal  Vagina: Normal  Cervix: Normal   MUSCULOSKELETAL: Normal range of motion. No tenderness.  No cyanosis, clubbing, or edema.  Assessment:   1. Vaginal discharge - NuSwab Vaginitis Plus (VG+)  2. Abnormal uterine bleeding - NuSwab Vaginitis Plus (VG+)   Plan:   Discussed treatment options for AUB including Mirena and endometrial ablation, handouts given.   At time point and time, patient desires endometrial ablation. Encouraged to make follow up appointment with Dr. Valentino Saxonherry to discuss options.   Reviewed red flag symptoms and when to call.   Labs: NuSwab, will contact patient via MyChart with results.   Rx: Diflucan and Flagyl, see orders.   RTC x 2-4 weeks for consultation with Dr. Valentino Saxonherry.    Gunnar BullaJenkins Michelle Lawhorn, CNM

## 2017-06-17 LAB — NUSWAB VAGINITIS PLUS (VG+)
Atopobium vaginae: HIGH Score — AB
Candida albicans, NAA: NEGATIVE
Candida glabrata, NAA: NEGATIVE
Chlamydia trachomatis, NAA: NEGATIVE
MEGASPHAERA 1: HIGH {score} — AB
Neisseria gonorrhoeae, NAA: NEGATIVE
TRICH VAG BY NAA: NEGATIVE

## 2017-06-18 ENCOUNTER — Telehealth: Payer: Self-pay | Admitting: Certified Nurse Midwife

## 2017-06-18 NOTE — Telephone Encounter (Signed)
Patient called and stated that she needs a prescription of (Phentermine) The patient stated that she has not been prescribed that medication before from [facility], but wanted a call back to confirm if she needs to be seen in order to get the medication. No other information was disclosed. Please advise.

## 2017-06-24 NOTE — Telephone Encounter (Signed)
Please advise patient current BMI: 26, prescription weight loss recommended for obesity or BMI greater than 30. Will not start medical weight loss at this time. Thanks, JML 

## 2017-06-25 NOTE — Telephone Encounter (Signed)
Left message to contact office

## 2017-07-02 NOTE — Telephone Encounter (Deleted)
Please advise patient current BMI: 26, prescription weight loss recommended for obesity or BMI greater than 30. Will not start medical weight loss at this time. Thanks, JML

## 2017-07-08 ENCOUNTER — Encounter: Payer: 59 | Admitting: Obstetrics and Gynecology

## 2017-07-10 ENCOUNTER — Ambulatory Visit: Payer: 59 | Admitting: Obstetrics and Gynecology

## 2017-07-10 ENCOUNTER — Encounter: Payer: Self-pay | Admitting: Obstetrics and Gynecology

## 2017-07-10 VITALS — BP 98/65 | HR 97 | Ht 63.0 in | Wt 152.5 lb

## 2017-07-10 DIAGNOSIS — N920 Excessive and frequent menstruation with regular cycle: Secondary | ICD-10-CM

## 2017-07-10 DIAGNOSIS — Z86718 Personal history of other venous thrombosis and embolism: Secondary | ICD-10-CM

## 2017-07-10 DIAGNOSIS — N760 Acute vaginitis: Secondary | ICD-10-CM

## 2017-07-10 NOTE — Progress Notes (Signed)
GYNECOLOGY PROGRESS NOTE  Subjective:    Patient ID: Maria Nixon, female    DOB: 29-Oct-1980, 36 y.o.   MRN: [id number]  HPI  Patient is a 36 y.o. 883P2012 female who presents as a referral from Serafina RoyalsMichelle Lawhorn, CNM for discussion of endometrial ablation to manage menorrhagia  Patient reports heavy menses ongoing for approximately 1.5 years. She has been having to use a super tampon plus a pad which she is changing every 1-2 hours for the first 2 days, and then decreasing to just a super tampon changing every 3-4 hours throughout the rest of the cycle.  Her periods have also become more prolonged, usually lasting approximately 1-2 weeks. Does report passage of clots. Patient notes that after her cycle ends, she may have 1-2 days of no bleeding, followed by 2-3 days of brown discharge. Periods are sometimes associated with mild to moderate dysmenorrhea.  Last Pap: 2016. Results were: normal. Last annual exam with US and lab work in October 2017 at [facility].   Additionally, she complains of recurrent vaginitis, and thinks that it is related to her lengthy heavy cycles.   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 Has tried Depo Provera in her early twenties, but cannot recall why she discontinued it (thinks it may have been due to side effects).  She states that she does not desire to try any other hormonal options at this time, and would like to discuss surgical management with endometrial ablation.  Patient does have current contraception status of bilateral tubal ligation.   The following portions of the patient's history were reviewed and updated as appropriate: allergies, current medications, past family history, past medical history, past social history, past surgical history and problem list.  Review of Systems Pertinent items noted in HPI and remainder of comprehensive ROS otherwise negative.   Objective:   Blood pressure 98/65, pulse 97, height 5\' 3"   (1.6 m), weight 152 lb 8 oz (69.2 kg), last menstrual period 06/27/2017. General appearance: alert and no distress Abdomen: soft, non-tender; bowel sounds normal; no masses,  no organomegaly Pelvic: external genitalia normal, rectovaginal septum normal.  Vagina without discharge.  Cervix normal appearing, no lesions and no motion tenderness.  Uterus mobile, nontender, normal shape and size.  Adnexae non-palpable, nontender bilaterally.  Extremities: extremities normal, atraumatic, no cyanosis or edema Neurologic: Grossly normal   Assessment:   Menorrhagia Dysmenorrhea H/o DVT Recurrent vaginitis  Plan:   - Discussed management options for abnormal uterine bleeding including tranexamic acid (Lysteda), oral progesterone, Depo Provera, Mirena IUD, endometrial ablation (Novasure/Hydrothermal Ablation) or hysterectomy as definitive surgical management.  Discussed risks and benefits of each method.   Patient desires endometrial ablation.  Printed patient education handouts were given to the patient to review at home. Desires to have procedure scheduled in January.  Will return in 4 weeks for pre-op exam.  - Dysmenorrhea, intermittent. Currently being managed with OTC pain meds.  - H/o DVT on combined OCPs, patient not a candidate for estrogen containing products for management of bleeding.  Will need SCDs for prophylaxis during surgery.  - Recurrent vaginitis, with most recent infection ~ 2 weeks ago.  Was treated for BV, and also advised on natural remedies for maintenance of vaginal pH by Serafina RoyalsMichelle Lawhorn, CNM.   A total of 15 minutes were spent face-to-face with the patient during this encounter and over half of that time dealt with counseling and coordination of care.   Valentino Saxonherry,  Dolphus Jenny, MD [facility]

## 2017-08-07 ENCOUNTER — Ambulatory Visit (INDEPENDENT_AMBULATORY_CARE_PROVIDER_SITE_OTHER): Payer: 59 | Admitting: Obstetrics and Gynecology

## 2017-08-07 ENCOUNTER — Encounter: Payer: Self-pay | Admitting: Obstetrics and Gynecology

## 2017-08-07 ENCOUNTER — Ambulatory Visit (INDEPENDENT_AMBULATORY_CARE_PROVIDER_SITE_OTHER): Payer: 59

## 2017-08-07 VITALS — BP 132/77 | HR 88 | Ht 63.0 in | Wt 154.0 lb

## 2017-08-07 DIAGNOSIS — N939 Abnormal uterine and vaginal bleeding, unspecified: Secondary | ICD-10-CM

## 2017-08-07 DIAGNOSIS — N920 Excessive and frequent menstruation with regular cycle: Secondary | ICD-10-CM

## 2017-08-07 DIAGNOSIS — Z01818 Encounter for other preprocedural examination: Secondary | ICD-10-CM

## 2017-08-07 NOTE — Patient Instructions (Signed)
You are scheduled for surgery called an Endometrial Ablation on 08/16/2017.  Nothing to eat after midnight on day prior to surgery.  Do not take any medications unless recommended by your provider on day prior to surgery.  Do not take NSAIDs (Motrin, Aleve) or aspirin 7 days prior to surgery.  You may take Tylenol products for minor aches and pains.  You will receive a prescription for pain medications post-operatively.  You will be contacted by phone approximately 1 week prior to surgery to schedule pre-operative appointment.  Please call the office if you have any questions regarding your upcoming surgery.

## 2017-08-07 NOTE — H&P (Signed)
GYNECOLOGY PREOPERATIVE HISTORY AND PHYSICAL   Subjective:  Maria Nixon is a 37 y.o. [id number] here for surgical management of abnormal uterine bleeding.  No significant preoperative concerns.  Proposed surgery: Hysteroscopy D&C with Novasure Endometrial Ablation    Pertinent Gynecological History: Menses: flow is excessive with use of a super tampon plus pad, changing every 1-2 hours on heaviest days, associated with passage of clots, usually lasting 7 to 14 days and with mild to moderate dysmenorrhea, and with intermenstrual spotting Contraception: tubal ligation Last pap: normal Date: 2016   Past Medical History:  Diagnosis Date  . Irregular periods    Past Surgical History:  Procedure Laterality Date  . TUBAL LIGATION     OB History  Gravida Para Term Preterm AB Living  3 2 2   1 2   SAB TAB Ectopic Multiple Live Births  1       2    # Outcome Date GA Lbr Len/2nd Weight Sex Delivery Anes PTL Lv  3 Term 11/24/10 5285w0d  9 lb 8 oz (4.309 kg) M CS-Unspec   LIV  2 Term 04/25/07 3958w0d  9 lb 1.9 oz (4.137 kg) F Vag-Spont   LIV  1 SAB 2001        ND    Patient denies any other pertinent gynecologic issues.  Family History  Problem Relation Age of Onset  . Rashes / Skin problems Mother   . Diabetes Maternal Grandmother   . Heart failure Maternal Grandfather   . Stroke Paternal Grandmother    Social History   Socioeconomic History  . Marital status: Married    Spouse name: Not on file  . Number of children: Not on file  . Years of education: Not on file  . Highest education level: Not on file  Social Needs  . Financial resource strain: Not on file  . Food insecurity - worry: Not on file  . Food insecurity - inability: Not on file  . Transportation needs - medical: Not on file  . Transportation needs - non-medical: Not on file  Occupational History  . Not on file  Tobacco Use  . Smoking status: Former Games developermoker  . Smokeless tobacco: Never Used  Substance and  Sexual Activity  . Alcohol use: Yes    Comment: SOCIAL  . Drug use: No  . Sexual activity: Yes    Partners: Male    Birth control/protection: None  Other Topics Concern  . Not on file  Social History Narrative  . Not on file   Current Outpatient Medications on File Prior to Visit  Medication Sig Dispense Refill  . ibuprofen (ADVIL,MOTRIN) 200 MG tablet Take 400-600 mg by mouth every 6 (six) hours as needed for headache or moderate pain.    . Melatonin 10 MG CAPS Take 10 mg by mouth at bedtime.     . phentermine (ADIPEX-P) 37.5 MG tablet Take 37.5 mg daily before breakfast by mouth.     No current facility-administered medications on file prior to visit.    No Known Allergies    Review of Systems Constitutional: No recent fever/chills/sweats Respiratory: No recent cough/bronchitis Cardiovascular: No chest pain Gastrointestinal: No recent nausea/vomiting/diarrhea Genitourinary: No UTI symptoms Hematologic/lymphatic:No history of coagulopathy or recent blood thinner use    Objective:   Blood pressure 132/77, pulse 88, height 5\' 3"  (1.6 m), weight 154 lb (69.9 kg), last menstrual period 07/21/2017. CONSTITUTIONAL: Well-developed, well-nourished female in no acute distress.  HENT:  Normocephalic, atraumatic, External right and left  ear normal. Oropharynx is clear and moist EYES: Conjunctivae and EOM are normal. Pupils are equal, round, and reactive to light. No scleral icterus.  NECK: Normal range of motion, supple, no masses SKIN: Skin is warm and dry. No rash noted. Not diaphoretic. No erythema. No pallor. NEUROLOGIC: Alert and oriented to person, place, and time. Normal reflexes, muscle tone coordination. No cranial nerve deficit noted. PSYCHIATRIC: Normal mood and affect. Normal behavior. Normal judgment and thought content. CARDIOVASCULAR: Normal heart rate noted, regular rhythm RESPIRATORY: Effort and breath sounds normal, no problems with respiration noted ABDOMEN: Soft,  nontender, nondistended. PELVIC: Deferred MUSCULOSKELETAL: Normal range of motion. No edema and no tenderness. 2+ distal pulses.    Labs: No results found for this or any previous visit (from the past 336 hour(s)).   Imaging Studies: US Pelvis Transvanginal Non-ob (tv Only)  Result Date: 08/07/2017 ULTRASOUND REPORT Location: [facility] Date of Service:  08/07/17 Indications: Pre-Op; AUB Findings: The uterus measures 8.7 x 7.0 x 5.4 cm. Echo texture is homogeneous without evidence of focal masses. The Endometrium measures 16.6 mm and appears trilaminar. Right Ovary measures 2.9 x 1.9 x 1.7 cm and appears WNL. Left Ovary measures 2.9 x 2.2 x 2.7 cm and contains a corpus luteal cyst. Survey of the adnexa demonstrates no adnexal masses. There is a small amount of free fluid in the cul de sac. Impression: 1. Retroverted uterus appears of normal size and contour. 2. The endometrium measures 16.6 mm and appears trilaminar. 3. Bilateral ovaries appear WNL.  The left ovary contains a corpus luteal cyst. 4. Small amount of free fluid fluid in the cul de sac. Recommendations: 1.Clinical correlation with the patient's History and Physical Exam. Kari Baars, RDMS I have reviewed this study and agree with documented findings. Hildred Laser, MD [facility]    Assessment:     Abnormal uterine bleeding      Plan:    Counseling: Procedure, risks, reasons, benefits and complications (including injury to uterus, bowel, bladder, major blood vessel, bleeding, possibility of transfusion, infection, or fistula formation) reviewed in detail. Likelihood of success in alleviating the patient's condition was discussed. Routine postoperative instructions will be reviewed with the patient and her family in detail after surgery.  The patient concurred with the proposed plan, giving informed written consent for the surgery.   Preop testing ordered.   Instructions reviewed, including NPO after midnight.       Hildred Laser, MD [facility]

## 2017-08-07 NOTE — Progress Notes (Signed)
Pt is here for a preop for an ablation 08/16/17.

## 2017-08-07 NOTE — Progress Notes (Signed)
    GYNECOLOGY PROGRESS NOTE  Subjective:    Patient ID: Maria Nixon, female    DOB: 02-Apr-1981, 37 y.o.   MRN: [id number]  HPI  Patient is a 37 y.o. [id number] female who presents for pre-operative exam.  She is currently scheduled for endometrial ablation (NovaSure) for abnormal uterine bleeding.  She denies complaints today.   The following portions of the patient's history were reviewed and updated as appropriate: allergies, current medications, past family history, past medical history, past social history, past surgical history and problem list.  Review of Systems Pertinent items noted in HPI and remainder of comprehensive ROS otherwise negative.   Objective:   Blood pressure 132/77, pulse 88, height 5\' 3"  (1.6 m), weight 154 lb (69.9 kg), last menstrual period 07/21/2017. General appearance: alert and no distress Abdomen: soft, non-tender; bowel sounds normal; no masses,  no organomegaly Pelvic: cervix normal in appearance, external genitalia normal, no adnexal masses or tenderness, no cervical motion tenderness, rectovaginal septum normal, uterus normal size, shape, and consistency and vagina normal without discharge Extremities: extremities normal, atraumatic, no cyanosis or edema Neurologic: Grossly normal   Assessment:   Pre-operative examination Abnormal uterine bleeding  Plan:   - Patient desires surgical management with endometrial ablation.  The risks of surgery were discussed in detail with the patient including but not limited to: bleeding which may require transfusion or reoperation; infection which may require prolonged hospitalization or re-hospitalization and antibiotic therapy; injury to uterus, bowel, bladder and major vessels or other surrounding organs; need for additional procedures including laparotomy; thromboembolic phenomenon, and other postoperative or anesthesia complications.  Patient was told that the likelihood that her condition and symptoms will be  treated effectively with this surgical management was very high; the postoperative expectations were also discussed in detail. The patient also understands the alternative treatment options which were discussed in full. All questions were answered.  She was told that she will be contacted by our surgical scheduler regarding the time and date of her surgery; routine preoperative instructions of having nothing to eat or drink after midnight on the day prior to surgery and also coming to the hospital 1.5 hours prior to her time of surgery were also emphasized.  She was told she may be called for a preoperative appointment about a week prior to surgery and will be given further preoperative instructions at that visit. Printed patient education handouts about the procedure were given to the patient to review at home. - She is currently scheduled for an ultrasound later this morning to assess uterine cavity (last ultrasound was at outside facility over 1 year ago) .  Hildred Laserherry, Hannelore Bova, MD [facility]

## 2017-08-09 ENCOUNTER — Other Ambulatory Visit: Payer: Self-pay

## 2017-08-09 ENCOUNTER — Encounter
Admission: RE | Admit: 2017-08-09 | Discharge: 2017-08-09 | Disposition: A | Discharge status: Home / Self Care | Payer: 59 | Source: Ambulatory Visit | Attending: Obstetrics and Gynecology | Admitting: Obstetrics and Gynecology

## 2017-08-09 ENCOUNTER — Telehealth: Payer: Self-pay | Admitting: Obstetrics and Gynecology

## 2017-08-09 HISTORY — DX: Adverse effect of unspecified anesthetic, initial encounter: T41.45XA

## 2017-08-09 HISTORY — DX: Other complications of anesthesia, initial encounter: T88.59XA

## 2017-08-09 HISTORY — DX: Nausea with vomiting, unspecified: R11.2

## 2017-08-09 HISTORY — DX: Other specified postprocedural states: Z98.890

## 2017-08-09 NOTE — Patient Instructions (Signed)
  Your procedure is scheduled on: 08-16-16 FRIDAY Report to Same Day Surgery 2nd floor medical mall [facility](Medical Mall Entrance-take elevator on left to 2nd floor.  Check in with surgery information desk.) To find out your arrival time please call [phone] between 1PM - 3PM on 08-15-16 THURSDAY  Remember: Instructions that are not followed completely may result in serious medical risk, up to and including death, or upon the discretion of your surgeon and anesthesiologist your surgery may need to be rescheduled.    _x___ 1. Do not eat food after midnight the night before your procedure. NO GUM OR CANDY AFTER MIDNIGHT.  You may drink clear liquids up to 2 hours before you are scheduled to arrive at the hospital for your procedure.  Do not drink clear liquids within 2 hours of your scheduled arrival to the hospital.  Clear liquids include  --Water or Apple juice without pulp  --Clear carbohydrate beverage such as ClearFast or Gatorade  --Black Coffee or Clear Tea (No milk, no creamers, do not add anything to the coffee or Tea       __x__ 2. No Alcohol for 24 hours before or after surgery.   __x__3. No Smoking for 24 prior to surgery.   ____  4. Bring all medications with you on the day of surgery if instructed.    __x__ 5. Notify your doctor if there is any change in your medical condition     (cold, fever, infections).     Do not wear jewelry, make-up, hairpins, clips or nail polish.  Do not wear lotions, powders, or perfumes. You may wear deodorant.  Do not shave 48 hours prior to surgery. Men may shave face and neck.  Do not bring valuables to the hospital.    [facility] is not responsible for any belongings or valuables.               Contacts, dentures or bridgework may not be worn into surgery.  Leave your suitcase in the car. After surgery it may be brought to your room.  For patients admitted to the hospital, discharge time is determined by your treatment team.   Patients  discharged the day of surgery will not be allowed to drive home.  You will need someone to drive you home and stay with you the night of your procedure.    Please read over the following fact sheets that you were given:   [facility] Preparing for Surgery and or MRSA Information   ____ Take anti-hypertensive listed below, cardiac, seizure, asthma, anti-reflux and psychiatric medicines. These include:  1. NONE  2.  3.  4.  5.  6.  ____Fleets enema or Magnesium Citrate as directed.   ____ Use CHG Soap or sage wipes as directed on instruction sheet   ____ Use inhalers on the day of surgery and bring to hospital day of surgery  ____ Stop Metformin and Janumet 2 days prior to surgery.    ____ Take 1/2 of usual insulin dose the night before surgery and none on the morning surgery.   ____ Follow recommendations from Cardiologist, Pulmonologist or PCP regarding stopping Aspirin, Coumadin, Plavix ,Eliquis, Effient, or Pradaxa, and Pletal.  X____Stop Anti-inflammatories such as Advil, Aleve, Ibuprofen, Motrin, Naproxen, Naprosyn, Goodies powders or aspirin products NOW-OK to take Tylenol    _x___ Stop supplements until after surgery-STOP PHENTERMINE AND MELATONIN NOW-MAY RESUME AFTER SURGERY   ____ Bring C-Pap to the hospital.

## 2017-08-09 NOTE — Telephone Encounter (Signed)
Pt aware we do not due hysteroscopy d/c in office. She has a high deductible. If will be 5500 out of pocket for her. Pt states at this time she will proceed.

## 2017-08-09 NOTE — Telephone Encounter (Signed)
The patient called and stated that she will not be able to afford the procedure she is having done at the hospital, and would like to know if it could be done in house at [facility]. Please advise.

## 2017-08-13 ENCOUNTER — Inpatient Hospital Stay: Admission: RE | Admit: 2017-08-13 | Payer: 59 | Source: Ambulatory Visit

## 2017-08-16 ENCOUNTER — Ambulatory Visit: Admission: RE | Admit: 2017-08-16 | Payer: 59 | Source: Ambulatory Visit | Admitting: Obstetrics and Gynecology

## 2017-08-16 ENCOUNTER — Encounter: Admission: RE | Payer: Self-pay | Source: Ambulatory Visit

## 2017-08-16 SURGERY — DILATATION & CURETTAGE/HYSTEROSCOPY WITH NOVASURE ABLATION
Anesthesia: General

## 2017-10-07 NOTE — H&P (Signed)
[name] is a 37 [facility]. [facility]  . Pt was seen for a second opinion  . [facility] and was [facility] by Dr Valentino Saxonherry . Pt has a 3 yr h/o of heavy menstrual fluid 7-14  Per month . metorrhagia as well . No dyspareunia , some dysmenorrhea . U/S jan 2019 failed to show etiology of bleeding .  She was on OCPs age 37 and developed " blood clots in legs". Depo Provera didn't agree with her .   She was interested in an endometrial ablation  S/p BTL   EMBX : benign . 1/19 Pap wnl 1/19 Past Medical History:  has a past medical history of DVT (deep venous thrombosis) (CMS-HCC) (as teenager) and Menorrhagia.  Past Surgical History:  has a past surgical history that includes Tubal ligation (2012) and Cesarean section. Family History: family history includes Diabetes type II in her maternal grandmother; Heart failure in her maternal grandfather; No Known Problems in her father and mother; Parkinsonism in her paternal grandfather. Social History:  reports that she quit smoking about 11 years ago. She has never used smokeless tobacco. She reports that she does not drink alcohol or use drugs. OB/GYN History:          OB History    Gravida  3   Para  2   Term      Preterm      AB  1   Living  2     SAB      TAB      Ectopic      Molar      Multiple      Live Births  2          Allergies: has No Known Allergies. Medications:  Current Outpatient Medications:  .  PHENTERMINE HCL (PHENTERMINE ORAL), Take by mouth., Disp: , Rfl:   Review of Systems: General:                      No fatigue or weight loss Eyes:                           No vision changes Ears:                            No hearing difficulty Respiratory:                No cough or shortness of breath Pulmonary:                  No asthma or shortness of breath Cardiovascular:           No chest pain, palpitations, dyspnea on exertion Gastrointestinal:          No  abdominal bloating, chronic diarrhea, constipations, masses, pain or hematochezia Genitourinary:             No hematuria, dysuria, abnormal vaginal discharge, pelvic pain,+ Menometrorrhagia Lymphatic:                   No swollen lymph nodes Musculoskeletal:         No muscle weakness Neurologic:                  No extremity weakness, syncope, seizure disorder Psychiatric:  No history of depression, delusions or suicidal/homicidal ideation    Exam:      Vitals:   08/19/17 0843  BP: (!) 131/90  Pulse: 79    Body mass index is 27.28 kg/m.  WDWN white/  female in NAD   Lungs: CTA  CV : RRR without murmur    Neck:  no thyromegaly Abdomen: soft , no mass, normal active bowel sounds,  non-tender, no rebound tenderness Pelvic: tanner stage 5 ,  External genitalia: vulva /labia no lesions Urethra: no prolapse Vagina: normal physiologic d/c Cervix: no lesions, no cervical motion tenderness   Uterus: normal size shape and contour, non-tender, uterine descensus grade 2-3  Adnexa: no mass,  non-tender   Rectovaginal: Endometrial biopsy: The cervix was cleaned with betadine and a single tooth tenaculum is applied to the anterior cervix. The Pipelle catheter was placed into the endometrial cavity. It sounds to 9.5 cm and adequate tissue was removed.   Impression:   The primary encounter diagnosis was Menorrhagia with irregular cycle. A diagnosis of Routine cervical smear was also pertinent to this visit.    Plan:   I have spoken with the patient regarding treatment options including expectant management, hormonal options, or surgical intervention. After a full discussion the pt elects to proceed with  [facility] + bilateral salpingectomy   risks of the procedure discussed with the pt       Orders Placed This Encounter  Procedures  . CBC w/auto Differential (5 Part)  . Ferritin  . Pathology Report - [facility]    Endometrial Biopsy  . Pap IG, HPV-hr -  [facility]    Order Specific Question:   [facility] Specimen source for cytology test:    Answer:   Cervical    Order Specific Question:   [facility] Collection method:    Answer:   Shellee Milo    Return if symptoms worsen or fail to improve.  Vilma Prader, MD          Riggs Dineen, Sabas Sous, MD on 10/08/17      Office Visit on 08/19/2017          Note viewed by patient     Department   Name Address Phone Fax  [facility] [address] [place] [address] [phone]   Service Location   Name Address    [facility] MEDICINE SERVICE AREA [address] [place] [address]

## 2017-10-11 ENCOUNTER — Encounter
Admission: RE | Admit: 2017-10-11 | Discharge: 2017-10-11 | Disposition: A | Discharge status: Home / Self Care | Payer: 59 | Source: Ambulatory Visit | Attending: Obstetrics and Gynecology | Admitting: Obstetrics and Gynecology

## 2017-10-11 ENCOUNTER — Other Ambulatory Visit: Payer: Self-pay

## 2017-10-11 NOTE — Patient Instructions (Addendum)
Your procedure is scheduled on: 10-25-17 FRIDAY Report to Same Day Surgery 2nd floor medical mall [facility](Medical Mall Entrance-take elevator on left to 2nd floor.  Check in with surgery information desk.) To find out your arrival time please call [phone] between 1PM - 3PM on 10-24-17 THURSDAY  Remember: Instructions that are not followed completely may result in serious medical risk, up to and including death, or upon the discretion of your surgeon and anesthesiologist your surgery may need to be rescheduled.    _x___ 1. Do not eat food after midnight the night before your procedure. NO GUM OR CANDY AFTER MIDNIGHT.  You may drink clear liquids up to 2 hours before you are scheduled to arrive at the hospital for your procedure.  Do not drink clear liquids within 2 hours of your scheduled arrival to the hospital.  Clear liquids include  --Water or Apple juice without pulp  --Clear carbohydrate beverage such as ClearFast or Gatorade  --Black Coffee or Clear Tea (No milk, no creamers, do not add anything to  the coffee or Tea    __x__ 2. No Alcohol for 24 hours before or after surgery.   __x__3. No Smoking or e-cigarettes for 24 prior to surgery.  Do not use any chewable tobacco products for at least 6 hour prior to surgery   ____  4. Bring all medications with you on the day of surgery if instructed.    __x__ 5. Notify your doctor if there is any change in your medical condition     (cold, fever, infections).    x___6. On the morning of surgery brush your teeth with toothpaste and water.  You may rinse your mouth with mouth wash if you wish.  Do not swallow any toothpaste or mouthwash.   Do not wear jewelry, make-up, hairpins, clips or nail polish.  Do not wear lotions, powders, or perfumes. You may wear deodorant.  Do not shave 48 hours prior to surgery. Men may shave face and neck.  Do not bring valuables to the hospital.    [facility] is not responsible for any belongings or  valuables.               Contacts, dentures or bridgework may not be worn into surgery.  Leave your suitcase in the car. After surgery it may be brought to your room.  For patients admitted to the hospital, discharge time is determined by your treatment team.  _  Patients discharged the day of surgery will not be allowed to drive home.  You will need someone to drive you home and stay with you the night of your procedure.    Please read over the following fact sheets that you were given:   [facility] Preparing for [facility] and or MRSA Information   ____ Take anti-hypertensive listed below, cardiac, seizure, asthma, anti-reflux and psychiatric medicines. These include:  1. NONE  2.  3.  4.  5.  6.  __X__Fleets enema as directed-DO FLEET ENEMA AT HOME DAY OF SURGERY 1 HOUR PRIOR TO ARRIVAL TIME TO HOSPITAL   ____ Use CHG Soap or sage wipes as directed on instruction sheet   ____ Use inhalers on the day of surgery and bring to hospital day of surgery  ____ Stop Metformin and Janumet 2 days prior to surgery.    ____ Take 1/2 of usual insulin dose the night before surgery and none on the morning surgery.   ____ Follow recommendations from Cardiologist, Pulmonologist or PCP regarding  stopping Aspirin, Coumadin, Plavix ,Eliquis, Effient, or Pradaxa, and Pletal.  X____Stop Anti-inflammatories such as Advil, Aleve, Ibuprofen, Motrin, Naproxen, Naprosyn, Goodies powders or aspirin products  7 DAYS PRIOR TO SURGERY-OK to take Tylenol   _x___ Stop supplements until after surgery-STOP MELATONIN 7 DAYS PRIOR TO SURGERY-MAY RESUME AFTER SURGERY- PT HAS ALREADY STOPPED PHENTERMINE   ____ Bring C-Pap to the hospital.

## 2017-10-15 ENCOUNTER — Encounter
Admission: RE | Admit: 2017-10-15 | Discharge: 2017-10-15 | Disposition: A | Discharge status: Home / Self Care | Payer: 59 | Source: Ambulatory Visit | Attending: Obstetrics and Gynecology | Admitting: Obstetrics and Gynecology

## 2017-10-15 DIAGNOSIS — Z01812 Encounter for preprocedural laboratory examination: Secondary | ICD-10-CM | POA: Diagnosis not present

## 2017-10-15 LAB — CBC
HCT: 36.6 % (ref 35.0–47.0)
Hemoglobin: 12.3 g/dL (ref 12.0–16.0)
MCH: 29.4 pg (ref 26.0–34.0)
MCHC: 33.6 g/dL (ref 32.0–36.0)
MCV: 87.6 fL (ref 80.0–100.0)
PLATELETS: 286 10*3/uL (ref 150–440)
RBC: 4.18 MIL/uL (ref 3.80–5.20)
RDW: 14.1 % (ref 11.5–14.5)
WBC: 4.9 10*3/uL (ref 3.6–11.0)

## 2017-10-15 LAB — BASIC METABOLIC PANEL
Anion gap: 7 (ref 5–15)
BUN: 8 mg/dL (ref 6–20)
CHLORIDE: 109 mmol/L (ref 101–111)
CO2: 22 mmol/L (ref 22–32)
Calcium: 8.6 mg/dL — ABNORMAL LOW (ref 8.9–10.3)
Creatinine, Ser: 0.73 mg/dL (ref 0.44–1.00)
GFR calc non Af Amer: >60 mL/min (ref >60)
Glucose, Bld: 90 mg/dL (ref 65–99)
POTASSIUM: 4.1 mmol/L (ref 3.5–5.1)
SODIUM: 138 mmol/L (ref 135–145)

## 2017-10-15 LAB — TYPE AND SCREEN
ABO/RH(D): A POS
Antibody Screen: NEGATIVE

## 2017-10-25 ENCOUNTER — Ambulatory Visit: Payer: 59 | Admitting: Anesthesiology

## 2017-10-25 ENCOUNTER — Encounter: Payer: Self-pay | Admitting: Anesthesiology

## 2017-10-25 ENCOUNTER — Observation Stay
Admission: RE | Admit: 2017-10-25 | Discharge: 2017-10-26 | Disposition: A | Discharge status: Home / Self Care | Payer: 59 | Source: Ambulatory Visit | Attending: Obstetrics and Gynecology | Admitting: Obstetrics and Gynecology

## 2017-10-25 ENCOUNTER — Encounter
Admission: RE | Disposition: A | Discharge status: Home / Self Care | Payer: Self-pay | Source: Ambulatory Visit | Attending: Obstetrics and Gynecology

## 2017-10-25 DIAGNOSIS — N888 Other specified noninflammatory disorders of cervix uteri: Secondary | ICD-10-CM | POA: Diagnosis not present

## 2017-10-25 DIAGNOSIS — Z87891 Personal history of nicotine dependence: Secondary | ICD-10-CM | POA: Diagnosis not present

## 2017-10-25 DIAGNOSIS — Z79899 Other long term (current) drug therapy: Secondary | ICD-10-CM | POA: Diagnosis not present

## 2017-10-25 DIAGNOSIS — Z9851 Tubal ligation status: Secondary | ICD-10-CM | POA: Insufficient documentation

## 2017-10-25 DIAGNOSIS — N9971 Accidental puncture and laceration of a genitourinary system organ or structure during a genitourinary system procedure: Secondary | ICD-10-CM | POA: Insufficient documentation

## 2017-10-25 DIAGNOSIS — N72 Inflammatory disease of cervix uteri: Secondary | ICD-10-CM | POA: Diagnosis not present

## 2017-10-25 DIAGNOSIS — N920 Excessive and frequent menstruation with regular cycle: Secondary | ICD-10-CM | POA: Diagnosis present

## 2017-10-25 DIAGNOSIS — Z86718 Personal history of other venous thrombosis and embolism: Secondary | ICD-10-CM | POA: Diagnosis not present

## 2017-10-25 DIAGNOSIS — Y836 Removal of other organ (partial) (total) as the cause of abnormal reaction of the patient, or of later complication, without mention of misadventure at the time of the procedure: Secondary | ICD-10-CM | POA: Insufficient documentation

## 2017-10-25 DIAGNOSIS — Z9889 Other specified postprocedural states: Secondary | ICD-10-CM

## 2017-10-25 DIAGNOSIS — N813 Complete uterovaginal prolapse: Secondary | ICD-10-CM | POA: Insufficient documentation

## 2017-10-25 HISTORY — PX: VAGINAL HYSTERECTOMY: SHX2639

## 2017-10-25 HISTORY — PX: BILATERAL SALPINGECTOMY: SHX5743

## 2017-10-25 LAB — CBC
HCT: 34.8 % — ABNORMAL LOW (ref 35.0–47.0)
HEMOGLOBIN: 11.7 g/dL — AB (ref 12.0–16.0)
MCH: 29.2 pg (ref 26.0–34.0)
MCHC: 33.7 g/dL (ref 32.0–36.0)
MCV: 86.5 fL (ref 80.0–100.0)
PLATELETS: 289 10*3/uL (ref 150–440)
RBC: 4.02 MIL/uL (ref 3.80–5.20)
RDW: 13.6 % (ref 11.5–14.5)
WBC: 4 10*3/uL (ref 3.6–11.0)

## 2017-10-25 LAB — ABO/RH: ABO/RH(D): A POS

## 2017-10-25 LAB — POCT PREGNANCY, URINE: PREG TEST UR: NEGATIVE

## 2017-10-25 SURGERY — HYSTERECTOMY, VAGINAL
Anesthesia: General | Site: Vagina | Wound class: Clean Contaminated

## 2017-10-25 MED ORDER — CEFAZOLIN SODIUM-DEXTROSE 2-4 GM/100ML-% IV SOLN
2.0000 g | Freq: Once | INTRAVENOUS | Status: AC
  Administered 2017-10-25: 2 g via INTRAVENOUS

## 2017-10-25 MED ORDER — SODIUM CHLORIDE 0.9 % IJ SOLN
INTRAMUSCULAR | Status: AC
  Filled 2017-10-25: qty 50

## 2017-10-25 MED ORDER — ACETAMINOPHEN 10 MG/ML IV SOLN
INTRAVENOUS | Status: DC | PRN
  Administered 2017-10-25: 1000 mg via INTRAVENOUS

## 2017-10-25 MED ORDER — ROCURONIUM BROMIDE 50 MG/5ML IV SOLN
INTRAVENOUS | Status: AC
  Filled 2017-10-25: qty 1

## 2017-10-25 MED ORDER — OXYCODONE-ACETAMINOPHEN 5-325 MG PO TABS
1.0000 | ORAL_TABLET | ORAL | Status: DC | PRN
  Administered 2017-10-25: 1 via ORAL
  Filled 2017-10-25: qty 1

## 2017-10-25 MED ORDER — MORPHINE SULFATE (PF) 2 MG/ML IV SOLN
1.0000 mg | INTRAVENOUS | Status: DC | PRN
  Administered 2017-10-25: 2 mg via INTRAVENOUS
  Filled 2017-10-25: qty 1

## 2017-10-25 MED ORDER — PROMETHAZINE HCL 25 MG/ML IJ SOLN
6.2500 mg | Freq: Once | INTRAMUSCULAR | Status: AC
Start: 2017-10-25 — End: 2017-10-25
  Administered 2017-10-25: 6.25 mg via INTRAVENOUS

## 2017-10-25 MED ORDER — LIDOCAINE HCL (CARDIAC) 20 MG/ML IV SOLN
INTRAVENOUS | Status: DC | PRN
  Administered 2017-10-25: 60 mg via INTRAVENOUS

## 2017-10-25 MED ORDER — LACTATED RINGERS IV SOLN: INTRAVENOUS | Status: DC

## 2017-10-25 MED ORDER — LIDOCAINE HCL (PF) 2 % IJ SOLN
INTRAMUSCULAR | Status: AC
Start: 2017-10-25 — End: ?
  Filled 2017-10-25: qty 10

## 2017-10-25 MED ORDER — FENTANYL CITRATE (PF) 100 MCG/2ML IJ SOLN
25.0000 ug | INTRAMUSCULAR | Status: AC | PRN
Start: 2017-10-25 — End: 2017-10-25
  Administered 2017-10-25 (×6): 25 ug via INTRAVENOUS

## 2017-10-25 MED ORDER — LIDOCAINE-EPINEPHRINE 1 %-1:100000 IJ SOLN
INTRAMUSCULAR | Status: DC | PRN
  Administered 2017-10-25: 9 mL

## 2017-10-25 MED ORDER — PROPOFOL 10 MG/ML IV BOLUS
INTRAVENOUS | Status: DC | PRN
  Administered 2017-10-25: 120 mg via INTRAVENOUS

## 2017-10-25 MED ORDER — ACETAMINOPHEN 10 MG/ML IV SOLN
INTRAVENOUS | Status: AC
  Filled 2017-10-25: qty 100

## 2017-10-25 MED ORDER — ONDANSETRON HCL 4 MG/2ML IJ SOLN: 4.0000 mg | Freq: Four times a day (QID) | INTRAMUSCULAR | Status: DC | PRN

## 2017-10-25 MED ORDER — LACTATED RINGERS IV SOLN
INTRAVENOUS | Status: DC
  Administered 2017-10-25: 17:00:00 via INTRAVENOUS

## 2017-10-25 MED ORDER — ONDANSETRON HCL 4 MG/2ML IJ SOLN
INTRAMUSCULAR | Status: AC
  Filled 2017-10-25: qty 2

## 2017-10-25 MED ORDER — DEXAMETHASONE SODIUM PHOSPHATE 10 MG/ML IJ SOLN
INTRAMUSCULAR | Status: DC | PRN
  Administered 2017-10-25: 10 mg via INTRAVENOUS

## 2017-10-25 MED ORDER — PROMETHAZINE HCL 25 MG/ML IJ SOLN
INTRAMUSCULAR | Status: AC
  Administered 2017-10-25: 6.25 mg via INTRAVENOUS
  Filled 2017-10-25: qty 1

## 2017-10-25 MED ORDER — EPHEDRINE SULFATE 50 MG/ML IJ SOLN
INTRAMUSCULAR | Status: DC | PRN
  Administered 2017-10-25: 15 mg via INTRAVENOUS
  Administered 2017-10-25: 10 mg via INTRAVENOUS

## 2017-10-25 MED ORDER — FENTANYL CITRATE (PF) 100 MCG/2ML IJ SOLN
INTRAMUSCULAR | Status: DC | PRN
  Administered 2017-10-25: 50 ug via INTRAVENOUS
  Administered 2017-10-25: 25 ug via INTRAVENOUS
  Administered 2017-10-25: 100 ug via INTRAVENOUS

## 2017-10-25 MED ORDER — ONDANSETRON HCL 4 MG/2ML IJ SOLN
INTRAMUSCULAR | Status: DC | PRN
  Administered 2017-10-25: 4 mg via INTRAVENOUS

## 2017-10-25 MED ORDER — OXYCODONE-ACETAMINOPHEN 5-325 MG PO TABS
1.0000 | ORAL_TABLET | ORAL | Status: DC | PRN
  Administered 2017-10-26: 1 via ORAL
  Filled 2017-10-25: qty 1

## 2017-10-25 MED ORDER — LACTATED RINGERS IV SOLN
INTRAVENOUS | Status: DC
  Administered 2017-10-25: 10:00:00 via INTRAVENOUS

## 2017-10-25 MED ORDER — HYDROMORPHONE HCL 1 MG/ML IJ SOLN
INTRAMUSCULAR | Status: AC
  Administered 2017-10-25: 0.5 mg via INTRAVENOUS
  Filled 2017-10-25: qty 1

## 2017-10-25 MED ORDER — LACTATED RINGERS IV SOLN
INTRAVENOUS | Status: DC
  Administered 2017-10-25: 07:00:00 via INTRAVENOUS

## 2017-10-25 MED ORDER — METHYLENE BLUE 0.5 % INJ SOLN
INTRAVENOUS | Status: DC | PRN
  Administered 2017-10-25 (×2): 2 mL

## 2017-10-25 MED ORDER — EPHEDRINE SULFATE 50 MG/ML IJ SOLN
INTRAMUSCULAR | Status: AC
  Filled 2017-10-25: qty 1

## 2017-10-25 MED ORDER — FLUORESCEIN SODIUM 10 % IV SOLN
INTRAVENOUS | Status: DC | PRN
  Administered 2017-10-25: 50 mg via INTRAVENOUS

## 2017-10-25 MED ORDER — ONDANSETRON HCL 4 MG/2ML IJ SOLN
4.0000 mg | Freq: Once | INTRAMUSCULAR | Status: AC | PRN
  Administered 2017-10-25: 4 mg via INTRAVENOUS

## 2017-10-25 MED ORDER — FLEET ENEMA 7-19 GM/118ML RE ENEM: 1.0000 | ENEMA | Freq: Once | RECTAL | Status: DC

## 2017-10-25 MED ORDER — MIDAZOLAM HCL 2 MG/2ML IJ SOLN
INTRAMUSCULAR | Status: DC | PRN
  Administered 2017-10-25: 2 mg via INTRAVENOUS

## 2017-10-25 MED ORDER — FAMOTIDINE 20 MG PO TABS
ORAL_TABLET | ORAL | Status: AC
  Administered 2017-10-25: 20 mg via ORAL
  Filled 2017-10-25: qty 1

## 2017-10-25 MED ORDER — ONDANSETRON HCL 4 MG PO TABS: 4.0000 mg | ORAL_TABLET | Freq: Four times a day (QID) | ORAL | Status: DC | PRN

## 2017-10-25 MED ORDER — PROPOFOL 10 MG/ML IV BOLUS
INTRAVENOUS | Status: AC
  Filled 2017-10-25: qty 20

## 2017-10-25 MED ORDER — FENTANYL CITRATE (PF) 100 MCG/2ML IJ SOLN
INTRAMUSCULAR | Status: AC
  Administered 2017-10-25: 25 ug via INTRAVENOUS
  Filled 2017-10-25: qty 2

## 2017-10-25 MED ORDER — LIDOCAINE-EPINEPHRINE 1 %-1:100000 IJ SOLN
INTRAMUSCULAR | Status: AC
  Filled 2017-10-25: qty 1

## 2017-10-25 MED ORDER — CEFAZOLIN SODIUM-DEXTROSE 2-4 GM/100ML-% IV SOLN
INTRAVENOUS | Status: AC
  Filled 2017-10-25: qty 100

## 2017-10-25 MED ORDER — MIDAZOLAM HCL 2 MG/2ML IJ SOLN
INTRAMUSCULAR | Status: AC
  Filled 2017-10-25: qty 2

## 2017-10-25 MED ORDER — METHYLENE BLUE 1 % INJ SOLN
INTRAMUSCULAR | Status: AC
Start: 2017-10-25 — End: ?
  Filled 2017-10-25: qty 10

## 2017-10-25 MED ORDER — SUGAMMADEX SODIUM 200 MG/2ML IV SOLN
INTRAVENOUS | Status: AC
  Filled 2017-10-25: qty 2

## 2017-10-25 MED ORDER — SODIUM CHLORIDE FLUSH 0.9 % IV SOLN
INTRAVENOUS | Status: AC
  Filled 2017-10-25: qty 10

## 2017-10-25 MED ORDER — MORPHINE SULFATE (PF) 2 MG/ML IV SOLN
1.0000 mg | INTRAVENOUS | Status: DC | PRN
  Administered 2017-10-25 (×4): 2 mg via INTRAVENOUS
  Filled 2017-10-25 (×4): qty 1

## 2017-10-25 MED ORDER — FENTANYL CITRATE (PF) 250 MCG/5ML IJ SOLN
INTRAMUSCULAR | Status: AC
  Filled 2017-10-25: qty 5

## 2017-10-25 MED ORDER — FAMOTIDINE 20 MG PO TABS
20.0000 mg | ORAL_TABLET | Freq: Once | ORAL | Status: AC
  Administered 2017-10-25: 20 mg via ORAL

## 2017-10-25 MED ORDER — DEXAMETHASONE SODIUM PHOSPHATE 10 MG/ML IJ SOLN
INTRAMUSCULAR | Status: AC
  Filled 2017-10-25: qty 1

## 2017-10-25 MED ORDER — ONDANSETRON HCL 4 MG/2ML IJ SOLN
INTRAMUSCULAR | Status: AC
Start: 2017-10-25 — End: ?
  Filled 2017-10-25: qty 2

## 2017-10-25 MED ORDER — HYDROMORPHONE HCL 1 MG/ML IJ SOLN
0.5000 mg | INTRAMUSCULAR | Status: DC | PRN
  Administered 2017-10-25 (×2): 0.5 mg via INTRAVENOUS

## 2017-10-25 MED ORDER — FLUORESCEIN SODIUM 10 % IV SOLN
INTRAVENOUS | Status: AC
  Filled 2017-10-25: qty 5

## 2017-10-25 MED ORDER — KETOROLAC TROMETHAMINE 30 MG/ML IJ SOLN
INTRAMUSCULAR | Status: DC | PRN
  Administered 2017-10-25: 30 mg via INTRAVENOUS

## 2017-10-25 MED ORDER — KETOROLAC TROMETHAMINE 30 MG/ML IJ SOLN
30.0000 mg | Freq: Three times a day (TID) | INTRAMUSCULAR | Status: DC
  Administered 2017-10-25 – 2017-10-26 (×2): 30 mg via INTRAVENOUS
  Filled 2017-10-25 (×2): qty 1

## 2017-10-25 MED ORDER — ROCURONIUM BROMIDE 100 MG/10ML IV SOLN
INTRAVENOUS | Status: DC | PRN
  Administered 2017-10-25: 50 mg via INTRAVENOUS

## 2017-10-25 SURGICAL SUPPLY — 43 items
BAG URINE DRAINAGE (UROLOGICAL SUPPLIES) ×4 IMPLANT
CANISTER SUCT 1200ML W/VALVE (MISCELLANEOUS) ×4 IMPLANT
CATH FOLEY 2WAY  5CC 16FR (CATHETERS) ×2
CATH ROBINSON RED A/P 16FR (CATHETERS) ×4 IMPLANT
CATH URTH 16FR FL 2W BLN LF (CATHETERS) ×2 IMPLANT
COUNTER NEEDLE 20/40 LG (NEEDLE) ×4 IMPLANT
DRAPE PERI LITHO V/GYN (MISCELLANEOUS) ×4 IMPLANT
DRAPE SURG 17X11 SM STRL (DRAPES) ×4 IMPLANT
DRAPE UNDER BUTTOCK W/FLU (DRAPES) ×4 IMPLANT
ELECT REM PT RETURN 9FT ADLT (ELECTROSURGICAL) ×4
ELECTRODE REM PT RTRN 9FT ADLT (ELECTROSURGICAL) ×2 IMPLANT
GLOVE BIO SURGEON STRL SZ8 (GLOVE) ×4 IMPLANT
GLOVE PI ORTHOPRO 6.5 (GLOVE) ×2
GLOVE PI ORTHOPRO STRL 6.5 (GLOVE) ×2 IMPLANT
GOWN STRL REUS W/ TWL LRG LVL3 (GOWN DISPOSABLE) ×8 IMPLANT
GOWN STRL REUS W/ TWL XL LVL3 (GOWN DISPOSABLE) ×2 IMPLANT
GOWN STRL REUS W/TWL LRG LVL3 (GOWN DISPOSABLE) ×8
GOWN STRL REUS W/TWL XL LVL3 (GOWN DISPOSABLE) ×2
KIT TURNOVER CYSTO (KITS) ×4 IMPLANT
LABEL OR SOLS (LABEL) ×4 IMPLANT
NEEDLE FILTER BLUNT 18X 1/2SAF (NEEDLE) ×2
NEEDLE FILTER BLUNT 18X1 1/2 (NEEDLE) ×2 IMPLANT
NEEDLE HYPO 22GX1.5 SAFETY (NEEDLE) ×4 IMPLANT
PACK BASIN MINOR ARMC (MISCELLANEOUS) ×4 IMPLANT
PAD OB MATERNITY 4.3X12.25 (PERSONAL CARE ITEMS) ×4 IMPLANT
PAD PREP 24X41 OB/GYN DISP (PERSONAL CARE ITEMS) ×4 IMPLANT
SET CYSTO W/LG BORE CLAMP LF (SET/KITS/TRAYS/PACK) ×4 IMPLANT
SOL PREP PVP 2OZ (MISCELLANEOUS) ×4
SOLUTION PREP PVP 2OZ (MISCELLANEOUS) ×2 IMPLANT
SPONGE XRAY 4X4 16PLY STRL (MISCELLANEOUS) ×4 IMPLANT
SURGILUBE 2OZ TUBE FLIPTOP (MISCELLANEOUS) ×4 IMPLANT
SUT PDS 2-0 27IN (SUTURE) ×4 IMPLANT
SUT VIC AB 0 CT1 27 (SUTURE) ×8
SUT VIC AB 0 CT1 27XCR 8 STRN (SUTURE) ×8 IMPLANT
SUT VIC AB 0 CT1 36 (SUTURE) ×8 IMPLANT
SUT VIC AB 2-0 SH 27 (SUTURE) ×6
SUT VIC AB 2-0 SH 27XBRD (SUTURE) ×6 IMPLANT
SUT VIC AB 3-0 SH 27 (SUTURE) ×4
SUT VIC AB 3-0 SH 27X BRD (SUTURE) ×4 IMPLANT
SYR 10ML LL (SYRINGE) ×8 IMPLANT
SYR 50ML LL SCALE MARK (SYRINGE) ×4 IMPLANT
SYR CONTROL 10ML (SYRINGE) ×4 IMPLANT
WATER STERILE IRR 1000ML POUR (IV SOLUTION) ×4 IMPLANT

## 2017-10-25 NOTE — Brief Op Note (Signed)
10/25/2017  9:23 AM  PATIENT:  Maria Nixon  37 y.o. female  PRE-OPERATIVE DIAGNOSIS:  Menorrhagia  Uterine Decensus  POST-OPERATIVE DIAGNOSIS:  Menorrhagia  Uterine Decensus  PROCEDURE:  Procedure(s): HYSTERECTOMY VAGINAL (N/A) BILATERAL SALPINGECTOMY (Bilateral) Repair of cystotomy   SURGEON:  Surgeon(s) and Role:    * Malaina Mortellaro, Ihor Austinhomas J, MD - Primary    * Ward, Elenora Fenderhelsea C, MD - Assisting  PHYSICIAN ASSISTANT:   ASSISTANTS: none   ANESTHESIA:   general  EBL:  150 mL   BLOOD ADMINISTERED:none  DRAINS: Urinary Catheter (Foley)   LOCAL MEDICATIONS USED:  LIDOCAINE   SPECIMEN:  Source of Specimen:  cervix , uterus , fallopian tubes bilateral   DISPOSITION OF SPECIMEN:  PATHOLOGY  COUNTS:  YES  TOURNIQUET:  * No tourniquets in log *  DICTATION: .Other Dictation: Dictation Number verbal  PLAN OF CARE: Admit for overnight observation  PATIENT DISPOSITION:  PACU - hemodynamically stable.   Delay start of Pharmacological VTE agent (>24hrs) due to surgical blood loss or risk of bleeding: not applicable

## 2017-10-25 NOTE — Transfer of Care (Signed)
Immediate Anesthesia Transfer of Care Note  Patient: Maria Nixon  Procedure(s) Performed: HYSTERECTOMY VAGINAL (N/A Vagina ) BILATERAL SALPINGECTOMY (Bilateral Vagina )  Patient Location: PACU  Anesthesia Type:General  Level of Consciousness: awake, alert , oriented and patient cooperative  Airway & Oxygen Therapy: Patient Spontanous Breathing and Patient connected to nasal cannula oxygen  Post-op Assessment: Report given to RN and Post -op Vital signs reviewed and stable  Post vital signs: Reviewed and stable  Last Vitals:  Vitals Value Taken Time  BP 103/53 10/25/2017  9:34 AM  Temp 36.1 C 10/25/2017  9:34 AM  Pulse 79 10/25/2017  9:37 AM  Resp 18 10/25/2017  9:37 AM  SpO2 100 % 10/25/2017  9:37 AM  Vitals shown include unvalidated device data.  Last Pain:  Vitals:   10/25/17 0639  TempSrc: Oral         Complications: No apparent anesthesia complications

## 2017-10-25 NOTE — Op Note (Signed)
Maria Nixon, Maria Nixon              ACCOUNT NO.:  [id number]  MEDICAL RECORD NO.:  [id number]  LOCATION:                                 FACILITY:  PHYSICIAN:  Jennell Corner, MD     DATE OF BIRTH:  DATE OF PROCEDURE:  10/25/2017 DATE OF DISCHARGE:                              OPERATIVE REPORT   PREOPERATIVE DIAGNOSIS: 1. Menorrhagia. 2. Uterine descensus.  POSTOPERATIVE DIAGNOSIS: 1. Menorrhagia. 2. Uterine descensus. 3. Incidental cystotomy.  PROCEDURE PERFORMED: 1. Total vaginal hysterectomy. 2. Bilateral salpingectomy. 3. Cystotomy repair. 4. Cystoscopy   SURGEON:  Jennell Corner, MD  FIRST ASSISTANT:  Elenora Fender Ward, MD.  ANESTHESIA:  General endotracheal anesthesia.  INDICATION:  A 37 year old, gravida 2, para 2, with a long history of menorrhagia, bleeding up to half the month.  The patient was noted to have grade 2-3 uterine descensus on examination in the office.  DESCRIPTION OF PROCEDURE:  After adequate general endotracheal anesthesia, the patient was placed in dorsal supine position with the legs in the candy-cane stirrups.  The patient's lower abdomen, perineum, vagina were prepped and draped in a normal sterile fashion.  The patient did receive 2 g of IV Ancef prior to the commencement of the case.  Time- out was performed.  Straight catheterization of the bladder yielded 100 mL of clear urine.  A weighted speculum was placed in the posterior vaginal vault.  The cervix was grasped with 2 thyroid tenacula.  Cervix was circumferentially injected with 1% lidocaine with 1:100,000 epinephrine.  A direct posterior colpotomy incision was made upon entry into the posterior cul-de-sac.  A long-billed weighted speculum was placed.  The uterosacral ligaments were bilaterally clamped, transected, suture ligated with 0 Vicryl suture and tagged for later identification. The cervix was then circumferentially incised with the Bovie.  Cardinal ligaments were  bilaterally clamped, transected, suture ligated with 0 Vicryl suture.  The uterine arteries were then bilaterally clamped, transected, suture ligated with 0 Vicryl suture.  Upon trying to gain entrance to the anterior cul-de-sac that showed significant adhesions, an incidental cystotomy incision was made approximately 2 cm in length. Ultimately, the anterior cul-de-sac was entered sharply and the bladder was reflected anteriorly as the rest of the case proceeded.  Broad ligaments were bilaterally clamped, transected with 0 Vicryl suture, and the cornua were then bilaterally clamped, transected, and doubly suture ligated and the uterus and cervix were delivered.  A Foley catheter was placed into the bladder and methylene blue was instilled and showed the bladder defect, approximately 2 cm in horizontal length.  The incision was closed with a 2-layer closure of 2-0 Vicryl suture.  After the 1st layer was placed, the bladder was filled with saline and methylene blue and did not show any leakage.  This retrofilling of the bladder was repeated after the second layer of suture was placed.  180 mL was instilled and there was no leakage from the cystotomy site.  The bladder was drained.  Each fallopian tube was identified and clamped with Heaney- Ballentine clamp and distal portion of fallopian tubes were removed and pedicles were ligated with 0 Vicryl suture.  Ovaries appeared normal. Good hemostasis was noted.  Vaginal  cuff was then closed with a 0 Vicryl suture running, nonlocking.  The uterosacral ligaments were plicated centrally, and the rest of the cuff was closed with the 0 Vicryl suture. Good hemostasis was noted.  Cystoscope was brought up to the operative field and with lactated Ringer's used as distending medium, approximately 300 mL was placed in the bladder.  The cystotomy defect was noted at the trigone and the ureteral orifices were visualized and showed normal efflux of urine  bilaterally.  Cystoscope was removed and the bladder was drained with indwelling, Foley catheter.  INTRAOPERATIVE FLUIDS:  800 mL.  ESTIMATED BLOOD LOSS:  150 mL.  URINE:  150 mL.  COMPLICATIONS:  Incidental cystotomy with repair.  The patient tolerated the procedure well and was taken to the recovery room in good condition.    ______________________________ Jennell Corner, MD   ______________________________ Jennell Corner, MD    TS/MEDQ  D:  10/25/2017  T:  10/25/2017  Job:  [id number]

## 2017-10-25 NOTE — Progress Notes (Signed)
Patient ID: Maria Nixon, female   DOB: 1981/06/07, 37 y.o.   MRN: [id number] DOS   pain adequately control with percocet and IV morphine I spoke to the pt about the cystotomy and repair . I have explained 10 day use of foley and urology cystogram  Add toradol tonight and d/c tomorrow

## 2017-10-25 NOTE — Anesthesia Post-op Follow-up Note (Signed)
Anesthesia QCDR form completed.        

## 2017-10-25 NOTE — Anesthesia Postprocedure Evaluation (Signed)
Anesthesia Post Note  Patient: Maria Nixon  Procedure(s) Performed: HYSTERECTOMY VAGINAL (N/A Vagina ) BILATERAL SALPINGECTOMY (Bilateral Vagina )  Patient location during evaluation: PACU Anesthesia Type: General Level of consciousness: awake and alert Pain management: pain level controlled Vital Signs Assessment: post-procedure vital signs reviewed and stable Respiratory status: spontaneous breathing, nonlabored ventilation, respiratory function stable and patient connected to nasal cannula oxygen Cardiovascular status: blood pressure returned to baseline and stable Postop Assessment: no apparent nausea or vomiting Anesthetic complications: no     Last Vitals:  Vitals Value Taken Time  BP    Temp    Pulse    Resp    SpO2      Last Pain:  Vitals:   10/25/17 1126  TempSrc:   PainSc: 6                  [name] S

## 2017-10-25 NOTE — Progress Notes (Signed)
Ready for surgery . All questions answered . TVH and bilateral salpingectomy  Npo . Labs reviewed . Proceed

## 2017-10-25 NOTE — Anesthesia Preprocedure Evaluation (Addendum)
Anesthesia Evaluation  Patient identified by MRN, date of birth, ID band Patient awake    Reviewed: Allergy & Precautions, NPO status , Patient's Chart, lab work & pertinent test results, reviewed documented beta blocker date and time   History of Anesthesia Complications (+) PONV and history of anesthetic complications  Airway Mallampati: II  TM Distance: >3 FB     Dental  (+) Chipped   Pulmonary former smoker,           Cardiovascular      Neuro/Psych    GI/Hepatic   Endo/Other    Renal/GU      Musculoskeletal   Abdominal   Peds  Hematology   Anesthesia Other Findings   Reproductive/Obstetrics                            Anesthesia Physical Anesthesia Plan  ASA: II  Anesthesia Plan: General   Post-op Pain Management:    Induction: Intravenous  PONV Risk Score and Plan:   Airway Management Planned: Oral ETT  Additional Equipment:   Intra-op Plan:   Post-operative Plan:   Informed Consent: I have reviewed the patients History and Physical, chart, labs and discussed the procedure including the risks, benefits and alternatives for the proposed anesthesia with the patient or authorized representative who has indicated his/her understanding and acceptance.     Plan Discussed with: CRNA  Anesthesia Plan Comments:         Anesthesia Quick Evaluation

## 2017-10-25 NOTE — Anesthesia Procedure Notes (Signed)
Procedure Name: Intubation Date/Time: 10/25/2017 7:29 AM Performed by: Eben Burow, CRNA Pre-anesthesia Checklist: Emergency Drugs available, Suction available, Patient being monitored, Timeout performed and Patient identified Patient Re-evaluated:Patient Re-evaluated prior to induction Oxygen Delivery Method: Circle system utilized Preoxygenation: Pre-oxygenation with 100% oxygen Induction Type: IV induction Ventilation: Mask ventilation without difficulty Laryngoscope Size: Mac and 3 Grade View: Grade I Tube type: Oral Tube size: 7.0 mm Number of attempts: 1 Placement Confirmation: ETT inserted through vocal cords under direct vision,  positive ETCO2 and breath sounds checked- equal and bilateral Secured at: 20 cm Tube secured with: Tape Dental Injury: Teeth and Oropharynx as per pre-operative assessment

## 2017-10-26 ENCOUNTER — Encounter: Payer: Self-pay | Admitting: Obstetrics and Gynecology

## 2017-10-26 ENCOUNTER — Other Ambulatory Visit: Payer: Self-pay

## 2017-10-26 DIAGNOSIS — N920 Excessive and frequent menstruation with regular cycle: Secondary | ICD-10-CM | POA: Diagnosis not present

## 2017-10-26 LAB — CBC
HEMATOCRIT: 29.5 % — AB (ref 35.0–47.0)
HEMOGLOBIN: 10 g/dL — AB (ref 12.0–16.0)
MCH: 29.6 pg (ref 26.0–34.0)
MCHC: 33.9 g/dL (ref 32.0–36.0)
MCV: 87.3 fL (ref 80.0–100.0)
Platelets: 222 10*3/uL (ref 150–440)
RBC: 3.37 MIL/uL — AB (ref 3.80–5.20)
RDW: 13.8 % (ref 11.5–14.5)
WBC: 8.3 10*3/uL (ref 3.6–11.0)

## 2017-10-26 LAB — BASIC METABOLIC PANEL
Anion gap: 6 (ref 5–15)
BUN: 7 mg/dL (ref 6–20)
CHLORIDE: 110 mmol/L (ref 101–111)
CO2: 24 mmol/L (ref 22–32)
Calcium: 8.4 mg/dL — ABNORMAL LOW (ref 8.9–10.3)
Creatinine, Ser: 0.69 mg/dL (ref 0.44–1.00)
GFR calc non Af Amer: >60 mL/min (ref >60)
Glucose, Bld: 96 mg/dL (ref 65–99)
POTASSIUM: 4.1 mmol/L (ref 3.5–5.1)
SODIUM: 140 mmol/L (ref 135–145)

## 2017-10-26 MED ORDER — DOCUSATE SODIUM 100 MG PO CAPS: 100.0000 mg | ORAL_CAPSULE | Freq: Every day | ORAL | 2 refills | Status: AC | PRN

## 2017-10-26 MED ORDER — OXYCODONE-ACETAMINOPHEN 5-325 MG PO TABS: 1.0000 | ORAL_TABLET | ORAL | 0 refills | Status: DC | PRN

## 2017-10-26 MED ORDER — ONDANSETRON HCL 4 MG PO TABS: 4.0000 mg | ORAL_TABLET | Freq: Four times a day (QID) | ORAL | 0 refills | Status: DC | PRN

## 2017-10-26 MED ORDER — NITROFURANTOIN MONOHYD MACRO 100 MG PO CAPS: 100.0000 mg | ORAL_CAPSULE | Freq: Every day | ORAL | 0 refills | Status: AC

## 2017-10-26 MED ORDER — IBUPROFEN 800 MG PO TABS: 800.0000 mg | ORAL_TABLET | Freq: Three times a day (TID) | ORAL | 0 refills | Status: DC | PRN

## 2017-10-26 NOTE — Discharge Summary (Signed)
Physician Discharge Summary  Patient ID: Maria Nixon MRN: [id number] DOB/AGE: 38-Jul-1982 36 y.o.  Admit date: 10/25/2017 Discharge date: 10/26/2017  Admission Diagnoses: menorrhagia ,   Discharge Diagnoses: menorrhagia , incidental cystotomy + repair  Active Problems:   Postoperative state   Discharged Condition: good  Hospital Course: underwent a TVH , bilateral salpingectomy complicated by a incidental cystotomy due to excessive scarring at LUS. Repair performed and normal retrograde filling of the bladder and cystoscopy at the time of surgery . Post op did well .Pt will de discharged with a foley catheter which will remain for 10 days prophylactic macrobid written for   Consults: None  Significant Diagnostic Studies: labs:  Results for orders placed or performed during the hospital encounter of 10/25/17 (from the past 24 hour(s))  CBC     Status: Abnormal   Collection Time: 10/26/17  6:47 AM  Result Value Ref Range   WBC 8.3 3.6 - 11.0 K/uL   RBC 3.37 (L) 3.80 - 5.20 MIL/uL   Hemoglobin 10.0 (L) 12.0 - 16.0 g/dL   HCT 78.2 (L) 95.6 - 21.3 %   MCV 87.3 80.0 - 100.0 fL   MCH 29.6 26.0 - 34.0 pg   MCHC 33.9 32.0 - 36.0 g/dL   RDW 08.6 57.8 - 46.9 %   Platelets 222 150 - 440 K/uL  Basic metabolic panel     Status: Abnormal   Collection Time: 10/26/17  6:47 AM  Result Value Ref Range   Sodium 140 135 - 145 mmol/L   Potassium 4.1 3.5 - 5.1 mmol/L   Chloride 110 101 - 111 mmol/L   CO2 24 22 - 32 mmol/L   Glucose, Bld 96 65 - 99 mg/dL   BUN 7 6 - 20 mg/dL   Creatinine, Ser 6.29 0.44 - 1.00 mg/dL   Calcium 8.4 (L) 8.9 - 10.3 mg/dL   GFR calc non Af Amer >60 >60 mL/min   GFR calc Af Amer >60 >60 mL/min   Anion gap 6 5 - 15    Treatments: surgery: as above  Discharge Exam: Blood pressure 109/69, pulse 75, temperature 98.1 F (36.7 C), temperature source Oral, resp. rate 18, height 5\' 3"  (1.6 m), weight 68 kg (150 lb), last menstrual period 10/16/2017, SpO2 100  %. General appearance: alert and cooperative Resp: clear to auscultation bilaterally Cardio: regular rate and rhythm, S1, S2 normal, no murmur, click, rub or gallop GI: soft, non-tender; bowel sounds normal; no masses,  no organomegaly  Disposition: Discharge disposition: 01-Home or Self Care       Discharge Instructions    Call MD for:   Complete by:  As directed    Foley catheter problems , heavy vaginal bleeding   Call MD for:  difficulty breathing, headache or visual disturbances   Complete by:  As directed    Call MD for:  extreme fatigue   Complete by:  As directed    Call MD for:  hives   Complete by:  As directed    Call MD for:  persistant dizziness or light-headedness   Complete by:  As directed    Call MD for:  persistant nausea and vomiting   Complete by:  As directed    Call MD for:  redness, tenderness, or signs of infection (pain, swelling, redness, odor or green/yellow discharge around incision site)   Complete by:  As directed    Call MD for:  severe uncontrolled pain   Complete by:  As directed  Call MD for:  temperature >100.4   Complete by:  As directed    Diet - low sodium heart healthy   Complete by:  As directed    Increase activity slowly   Complete by:  As directed      Allergies as of 10/26/2017   No Known Allergies     Medication List    STOP taking these medications   phentermine 37.5 MG tablet Commonly known as:  ADIPEX-P     TAKE these medications   azelastine 0.1 % nasal spray Commonly known as:  ASTELIN Place 1 spray into both nostrils daily as needed for allergies.   docusate sodium 100 MG capsule Commonly known as:  COLACE Take 1 capsule (100 mg total) by mouth daily as needed.   ibuprofen 800 MG tablet Commonly known as:  ADVIL,MOTRIN Take 1 tablet (800 mg total) by mouth every 8 (eight) hours as needed. What changed:    medication strength  how much to take  when to take this  reasons to take this    loratadine 10 MG tablet Commonly known as:  CLARITIN Take 10 mg by mouth daily as needed for allergies.   Melatonin 10 MG Caps Take 10 mg by mouth at bedtime.   nitrofurantoin (macrocrystal-monohydrate) 100 MG capsule Commonly known as:  MACROBID Take 1 capsule (100 mg total) by mouth daily for 14 days.   ondansetron 4 MG tablet Commonly known as:  ZOFRAN Take 1 tablet (4 mg total) by mouth every 6 (six) hours as needed for nausea.   oxyCODONE-acetaminophen 5-325 MG tablet Commonly known as:  PERCOCET/ROXICET Take 1-2 tablets by mouth every 4 (four) hours as needed for moderate pain ((when tolerating fluids)).      Follow-up Information    Kaiyon Hynes, Ihor Austinhomas J, MD. Call in 10 day(s).   Specialty:  Obstetrics and Gynecology Why:  [facility] with urology / radiology for the same follow up date  Contact information: [address] [facility] [place] [id number] [phone]           Signed: Ihor Austinhomas J Jasean Ambrosia 10/26/2017, 11:27 AM

## 2017-10-26 NOTE — Progress Notes (Signed)
Discharge teaching completed with pt and husband.  Supplies given for leg bag and standard drainage bag plus discussed cleaning and changing bags.  Verb u/o of care of bags, f/u appt, and meds for home use.

## 2017-10-26 NOTE — Progress Notes (Signed)
Discharge to home.  To car via auxillary.  Foley intact

## 2017-10-29 ENCOUNTER — Other Ambulatory Visit: Payer: Self-pay | Admitting: Obstetrics and Gynecology

## 2017-10-29 DIAGNOSIS — S3729XS Other injury of bladder, sequela: Secondary | ICD-10-CM

## 2017-10-29 LAB — SURGICAL PATHOLOGY

## 2017-11-04 ENCOUNTER — Encounter: Payer: Self-pay | Admitting: Radiology

## 2017-11-04 ENCOUNTER — Ambulatory Visit
Admission: RE | Admit: 2017-11-04 | Discharge: 2017-11-04 | Disposition: A | Discharge status: Home / Self Care | Payer: 59 | Source: Ambulatory Visit | Attending: Obstetrics and Gynecology | Admitting: Obstetrics and Gynecology

## 2017-11-04 DIAGNOSIS — S3729XS Other injury of bladder, sequela: Secondary | ICD-10-CM | POA: Diagnosis present

## 2017-11-04 DIAGNOSIS — X58XXXS Exposure to other specified factors, sequela: Secondary | ICD-10-CM | POA: Diagnosis not present

## 2017-11-04 MED ORDER — IOTHALAMATE MEGLUMINE 17.2 % UR SOLN
175.0000 mL | Freq: Once | URETHRAL | Status: AC | PRN
  Administered 2017-11-04: 175 mL

## 2019-11-19 ENCOUNTER — Encounter: Payer: Self-pay | Admitting: Infectious Diseases

## 2019-11-19 ENCOUNTER — Ambulatory Visit: Payer: 59 | Attending: Infectious Diseases | Admitting: Infectious Diseases

## 2019-11-19 ENCOUNTER — Other Ambulatory Visit: Payer: Self-pay

## 2019-11-19 VITALS — BP 108/72 | HR 79 | Resp 16 | Ht 63.0 in | Wt 154.0 lb

## 2019-11-19 DIAGNOSIS — Z79899 Other long term (current) drug therapy: Secondary | ICD-10-CM | POA: Insufficient documentation

## 2019-11-19 DIAGNOSIS — R768 Other specified abnormal immunological findings in serum: Secondary | ICD-10-CM | POA: Diagnosis present

## 2019-11-19 DIAGNOSIS — Z87891 Personal history of nicotine dependence: Secondary | ICD-10-CM | POA: Diagnosis not present

## 2019-11-19 NOTE — Progress Notes (Signed)
NAME: Maria Nixon  DOB: Mar 17, 1981  MRN: [id number]  Date/Time: 11/19/2019 12:27 PM  REQUESTING PROVIDER Subjective:  REASON FOR CONSULT:  ? Maria Nixon is a 39 y.o. female with a history of menorrhagia s/p hysterectomy referred to me for n abnormal lyme test which was done as part of a work up for drooping eyelid Pt says her droopiness has resolved- she never had any palsy of the face. She works at [employer] She has no joint pain, never had rash  Past Medical History:  Diagnosis Date  . Complication of anesthesia   . Irregular periods   . PONV (postoperative nausea and vomiting)     Past Surgical History:  Procedure Laterality Date  . BILATERAL SALPINGECTOMY Bilateral 10/25/2017   Procedure: BILATERAL SALPINGECTOMY;  Surgeon: Schermerhorn, Gwen Her, MD;  Location: [facility];  Service: Gynecology;  Laterality: Bilateral;  . CESAREAN SECTION    . TUBAL LIGATION    . VAGINAL HYSTERECTOMY N/A 10/25/2017   Procedure: HYSTERECTOMY VAGINAL;  Surgeon: Schermerhorn, Gwen Her, MD;  Location: [facility];  Service: Gynecology;  Laterality: N/A;   cystotomy Bladder repair Social History   Socioeconomic History  . Marital status: Married    Spouse name: Not on file  . Number of children: Not on file  . Years of education: Not on file  . Highest education level: Not on file  Occupational History  . Not on file  Tobacco Use  . Smoking status: Former Smoker    Packs/day: 0.50    Years: 2.00    Pack years: 1.00    Types: Cigarettes    Quit date: 08/09/2006    Years since quitting: 13.2  . Smokeless tobacco: Never Used  Substance and Sexual Activity  . Alcohol use: Yes    Comment: SOCIAL  . Drug use: No  . Sexual activity: Yes    Partners: Male    Birth control/protection: None  Other Topics Concern  . Not on file  Social History Narrative  . Not on file   Social Determinants of Health   Financial Resource Strain:   . Difficulty of Paying Living Expenses:   Food  Insecurity:   . Worried About Charity fundraiser in the Last Year:   . Arboriculturist in the Last Year:   Transportation Needs:   . Film/video editor (Medical):   Marland Kitchen Lack of Transportation (Non-Medical):   Physical Activity:   . Days of Exercise per Week:   . Minutes of Exercise per Session:   Stress:   . Feeling of Stress :   Social Connections:   . Frequency of Communication with Friends and Family:   . Frequency of Social Gatherings with Friends and Family:   . Attends Religious Services:   . Active Member of Clubs or Organizations:   . Attends Archivist Meetings:   Marland Kitchen Marital Status:   Intimate Partner Violence:   . Fear of Current or Ex-Partner:   . Emotionally Abused:   Marland Kitchen Physically Abused:   . Sexually Abused:     Family History  Problem Relation Age of Onset  . Rashes / Skin problems Mother   . Diabetes Maternal Grandmother   . Heart failure Maternal Grandfather   . Stroke Paternal Grandmother    No Known Allergies  ? Current Outpatient Medications  Medication Sig Dispense Refill  . ergocalciferol (VITAMIN D2) 1.25 MG (50000 UT) capsule Take by mouth.    Marland Kitchen ibuprofen (ADVIL,MOTRIN) 800 MG tablet Take  1 tablet (800 mg total) by mouth every 8 (eight) hours as needed. 30 tablet 0  . Melatonin 10 MG CAPS Take 10 mg by mouth at bedtime.     . phentermine 37.5 MG capsule Take 37.5 mg by mouth every morning.     No current facility-administered medications for this visit.     Abtx:  Anti-infectives (From admission, onward)   None      REVIEW OF SYSTEMS:  Const: negative fever, negative chills, negative weight loss Eyes: negative diplopia or visual changes, negative eye pain ENT: negative coryza, negative sore throat Resp: negative cough, hemoptysis, dyspnea Cards: negative for chest pain, palpitations, lower extremity edema GU: negative for frequency, dysuria and hematuria GI: Negative for abdominal pain, diarrhea, bleeding, constipation Skin:  negative for rash and pruritus Heme: negative for easy bruising and gum/nose bleeding MS: negative for myalgias, arthralgias, back pain and muscle weakness Neurolo:negative for headaches, dizziness, vertigo, memory problems  Psych: negative for feelings of anxiety, depression  Endocrine: negative for thyroid, diabetes Allergy/Immunology- negative for any medication or food allergies ?   Objective:  VITALS:  BP 108/72   Pulse 79   Resp 16   Ht 5\' 3"  (1.6 m)   Wt 154 lb (69.9 kg)   LMP 10/16/2017   SpO2 98%   BMI 27.28 kg/m  PHYSICAL EXAM:  General: Alert, cooperative, no distress, appears stated age.  Head: Normocephalic, without obvious abnormality, atraumatic. Eyes: Conjunctivae clear, anicteric sclerae. Pupils are equal ENT Nares normal. No drainage or sinus tenderness. Lips, mucosa, and tongue normal. No Thrush Neck: Supple, symmetrical, no adenopathy, thyroid: non tender no carotid bruit and no JVD. Back: No CVA tenderness. Lungs: Clear to auscultation bilaterally. No Wheezing or Rhonchi. No rales. Heart: Regular rate and rhythm, no murmur, rub or gallop. Abdomen: not examined Extremities: atraumatic, no cyanosis. No edema. No clubbing Skin: No rashes or lesions. Or bruising Neurologic: Grossly non-focal Pertinent Labs Lab Results    ? Impression/Recommendation Pt referred for dropping eyelid and a reactive lyme test- she clinically has no facial palsy and never had one- says she was stressed when the rt eye  lid was tired  And droopy ?False positive Lyme WB as antibodies 0.88  And IgG wb has only 1band when a positivetest will have 5 bands and also IgM negative So she does not have lyme and no treatment needed ACE levels neg Myasthenia gravis work up looks neg with AchR modulating ab being neg Discussed the management with the patient ?

## 2019-11-19 NOTE — Patient Instructions (Addendum)
You  Are here for a abnormal lyme test which was done as part of drooping eyelid work up. Your EIA was 0.88 ( 0.79 n) and Ig G only 2 bands and IgM was 1 band- this is false positive and you dont need to worry about it or get it treated.

## 2021-05-29 ENCOUNTER — Other Ambulatory Visit: Payer: Self-pay | Admitting: Obstetrics and Gynecology

## 2021-06-01 ENCOUNTER — Other Ambulatory Visit: Payer: Self-pay | Admitting: Obstetrics and Gynecology

## 2021-06-01 DIAGNOSIS — Z1231 Encounter for screening mammogram for malignant neoplasm of breast: Secondary | ICD-10-CM

## 2021-06-20 ENCOUNTER — Other Ambulatory Visit: Payer: Self-pay

## 2021-06-20 ENCOUNTER — Ambulatory Visit
Admission: RE | Admit: 2021-06-20 | Discharge: 2021-06-20 | Disposition: A | Discharge status: Home / Self Care | Payer: 59 | Source: Ambulatory Visit | Attending: Obstetrics and Gynecology | Admitting: Obstetrics and Gynecology

## 2021-06-20 DIAGNOSIS — Z1231 Encounter for screening mammogram for malignant neoplasm of breast: Secondary | ICD-10-CM | POA: Insufficient documentation

## 2021-06-26 ENCOUNTER — Other Ambulatory Visit: Payer: Self-pay | Admitting: Obstetrics and Gynecology

## 2021-06-26 DIAGNOSIS — N6320 Unspecified lump in the left breast, unspecified quadrant: Secondary | ICD-10-CM

## 2021-06-26 DIAGNOSIS — R928 Other abnormal and inconclusive findings on diagnostic imaging of breast: Secondary | ICD-10-CM

## 2021-07-07 ENCOUNTER — Ambulatory Visit
Admission: RE | Admit: 2021-07-07 | Discharge: 2021-07-07 | Disposition: A | Discharge status: Home / Self Care | Payer: 59 | Source: Ambulatory Visit | Attending: Obstetrics and Gynecology | Admitting: Obstetrics and Gynecology

## 2021-07-07 ENCOUNTER — Other Ambulatory Visit: Payer: Self-pay

## 2021-07-07 DIAGNOSIS — N6320 Unspecified lump in the left breast, unspecified quadrant: Secondary | ICD-10-CM | POA: Diagnosis present

## 2021-07-07 DIAGNOSIS — R928 Other abnormal and inconclusive findings on diagnostic imaging of breast: Secondary | ICD-10-CM | POA: Diagnosis not present

## 2021-07-13 ENCOUNTER — Other Ambulatory Visit: Payer: Self-pay | Admitting: Obstetrics and Gynecology

## 2021-07-13 DIAGNOSIS — N6320 Unspecified lump in the left breast, unspecified quadrant: Secondary | ICD-10-CM

## 2021-07-13 DIAGNOSIS — R928 Other abnormal and inconclusive findings on diagnostic imaging of breast: Secondary | ICD-10-CM

## 2021-07-25 ENCOUNTER — Ambulatory Visit
Admission: RE | Admit: 2021-07-25 | Discharge: 2021-07-25 | Disposition: A | Discharge status: Home / Self Care | Payer: Managed Care, Other (non HMO) | Source: Ambulatory Visit | Attending: Obstetrics and Gynecology | Admitting: Obstetrics and Gynecology

## 2021-07-25 ENCOUNTER — Other Ambulatory Visit: Payer: Self-pay

## 2021-07-25 DIAGNOSIS — N6320 Unspecified lump in the left breast, unspecified quadrant: Secondary | ICD-10-CM | POA: Diagnosis present

## 2021-07-25 DIAGNOSIS — R928 Other abnormal and inconclusive findings on diagnostic imaging of breast: Secondary | ICD-10-CM | POA: Insufficient documentation

## 2021-07-25 HISTORY — PX: BREAST BIOPSY: SHX20

## 2021-07-26 ENCOUNTER — Encounter: Payer: Self-pay | Admitting: *Deleted

## 2021-07-26 LAB — SURGICAL PATHOLOGY

## 2021-07-26 NOTE — Progress Notes (Signed)
Patient returned my call.  She would like to see Dr. Maia Plan.  She is scheduled to see him on 08/08/21 @ 11:15.  She was encouraged to call with any questions or needs.

## 2021-07-26 NOTE — Progress Notes (Signed)
Received message from Randa Lynn, RN from [facility] that patient had been notified of her benign breast biopsy, and is aware navigation will call to coordinate her surgical consultation.   Left patient a message to return my call.

## 2021-08-08 ENCOUNTER — Other Ambulatory Visit: Payer: Self-pay | Admitting: General Surgery

## 2021-08-08 DIAGNOSIS — N6092 Unspecified benign mammary dysplasia of left breast: Secondary | ICD-10-CM

## 2021-08-09 ENCOUNTER — Ambulatory Visit: Payer: Self-pay | Admitting: General Surgery

## 2021-08-09 NOTE — H&P (View-Only) (Signed)
PATIENT PROFILE: Maria Nixon is a 41 y.o. female who presents to the Clinic for consultation at the request of Dr. 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  PCP:  Lovie Macadamia, MD  HISTORY OF PRESENT ILLNESS: Maria Nixon reports she had a screening mammogram and she was found with a distortion.  This led to diagnostic mammogram and ultrasound.  Diagnostic mammogram and ultrasound shows persistent distortion despite multiple views.  Area of distortion measured around 1 cm.  She had core biopsy of the left breast distortion.  Core biopsy shows atypical ductal hyperplasia.  I personally evaluated the images of the mammogram and ultrasound.  Family history of breast cancer: None Menarche: Around 41 years old Last menstrual period: On 2019 after hysterectomy due to heavy bleeding Used estrogen and progesterone therapy: None History of Radiation to the chest: None Number of pregnancies: 3 Age of first pregnancy: 42 (miscarriage).  Age of first complete pregnancy 41 year old  PROBLEM LIST: Problem List  Date Reviewed: 11/25/2020  None   GENERAL REVIEW OF SYSTEMS:   General ROS: negative for - chills, fatigue, fever, weight gain or weight loss Allergy and Immunology ROS: negative for - hives  Hematological and Lymphatic ROS: negative for - bleeding problems or bruising, negative for palpable nodes Endocrine ROS: negative for - heat or cold intolerance, hair changes Respiratory ROS: negative for - cough, shortness of breath or wheezing Cardiovascular ROS: no chest pain or palpitations GI ROS: negative for nausea, vomiting, abdominal pain, diarrhea, constipation Musculoskeletal ROS: negative for - joint swelling or muscle pain Neurological ROS: negative for - confusion, syncope Dermatological ROS: negative for pruritus and rash Psychiatric: negative for anxiety, depression, difficulty sleeping and memory loss  MEDICATIONS: Current Outpatient  Medications  Medication Sig Dispense Refill   brompheniramine-pseudoephed-DM (BROMFED DM) 2-30-10 mg/5 mL syrup Take 5 mLs by mouth every 6 (six) hours as needed (Patient not taking: Reported on 02/04/2020) 118 mL 0   ergocalciferol, vitamin D2, 1,250 mcg (50,000 unit) capsule Take 1 capsule (50,000 Units total) by mouth once a week (Patient not taking: Reported on 08/29/2020) 12 capsule 3   fluticasone propionate (FLONASE) 50 mcg/actuation nasal spray Place 2 sprays into both nostrils once daily (Patient not taking: Reported on 02/04/2020) 16 g 1   IBUPROFEN ORAL Take by mouth (Patient not taking: Reported on 08/29/2020)     melatonin 10 mg Cap Take by mouth (Patient not taking: Reported on 08/29/2020)     phentermine (ADIPEX-P) 37.5 MG capsule Take by mouth (Patient not taking: Reported on 11/25/2020)     No current facility-administered medications for this visit.    ALLERGIES: Patient has no known allergies.  PAST MEDICAL HISTORY: Past Medical History:  Diagnosis Date   DVT (deep venous thrombosis) (CMS-HCC) as teenager   due to [facility] and smoking   Menorrhagia     PAST SURGICAL HISTORY: Past Surgical History:  Procedure Laterality Date   TUBAL LIGATION  2012   TVH with bilateral salpingectomy  10/25/2017   CESAREAN SECTION     1     FAMILY HISTORY: Family History  Problem Relation Age of Onset   No Known Problems Mother    No Known Problems Father    Diabetes type II Maternal Grandmother    Heart failure Maternal Grandfather    Parkinsonism Paternal Grandfather      SOCIAL HISTORY: Social History   Socioeconomic History   Marital status: Married    Spouse name: Erlene Quan  Number of children: 2  Tobacco Use   Smoking status: Former    Types: Cigarettes    Quit date: 08/06/2006    Years since quitting: 15.0   Smokeless tobacco: Never  Vaping Use   Vaping Use: Never used  Substance and Sexual Activity   Alcohol use: Yes    Alcohol/week: 0.0 standard drinks    Comment:  social   Drug use: No   Sexual activity: Yes    Partners: Male    Birth control/protection: Surgical, None    PHYSICAL EXAM: Vitals:   08/08/21 1113  BP: (!) 140/83  Pulse: 83   Body mass index is 28.87 kg/m. Weight: 73.9 kg (163 lb)   GENERAL: Alert, active, oriented x3  HEENT: Pupils equal reactive to light. Extraocular movements are intact. Sclera clear. Palpebral conjunctiva normal red color.Pharynx clear.  NECK: Supple with no palpable mass and no adenopathy.  LUNGS: Sound clear with no rales rhonchi or wheezes.  HEART: Regular rhythm S1 and S2 without murmur.  BREAST: breasts appear normal, no suspicious masses, no skin or nipple changes or axillary nodes.  ABDOMEN: Soft and depressible, nontender with no palpable mass, no hepatomegaly.  EXTREMITIES: Well-developed well-nourished symmetrical with no dependent edema.  NEUROLOGICAL: Awake alert oriented, facial expression symmetrical, moving all extremities.  REVIEW OF DATA: I have reviewed the following data today: Initial consult on 08/08/2021  Component Date Value   Glucose 08/08/2021 99    Sodium 08/08/2021 138    Potassium 08/08/2021 4.4    Chloride 08/08/2021 107    Carbon Dioxide (CO2) 08/08/2021 21.9 (L)    Urea Nitrogen (BUN) 08/08/2021 11    Creatinine 08/08/2021 0.7    Glomerular Filtration Ra* 08/08/2021 92    Calcium 08/08/2021 9.2    AST  08/08/2021 16    ALT  08/08/2021 18    Alk Phos (alkaline Phosp* 08/08/2021 59    Albumin 08/08/2021 4.4    Bilirubin, Total 08/08/2021 0.4    Protein, Total 08/08/2021 6.7    A/G Ratio 08/08/2021 1.9    WBC (White Blood Cell Co* 08/08/2021 4.7    RBC (Red Blood Cell Coun* 08/08/2021 4.14    Hemoglobin 08/08/2021 12.8    Hematocrit 08/08/2021 37.7    MCV (Mean Corpuscular Vo* 08/08/2021 91.1    MCH (Mean Corpuscular He* 08/08/2021 30.9    MCHC (Mean Corpuscular H* 08/08/2021 34.0    Platelet Count 08/08/2021 316    RDW-CV (Red Cell Distrib* 08/08/2021  12.5    MPV (Mean Platelet Volum* 08/08/2021 9.7    Neutrophils 08/08/2021 2.74    Lymphocytes 08/08/2021 1.42    Monocytes 08/08/2021 0.35    Eosinophils 08/08/2021 0.09    Basophils 08/08/2021 0.05    Neutrophil % 08/08/2021 58.8    Lymphocyte % 08/08/2021 30.5    Monocyte % 08/08/2021 7.5    Eosinophil % 08/08/2021 1.9    Basophil% 08/08/2021 1.1    Immature Granulocyte % 08/08/2021 0.2    Immature Granulocyte Cou* 08/08/2021 0.01      ASSESSMENT: Maria Nixon is a 41 y.o. female presenting for consultation for left breast atypical hyperplasia.    Patient was oriented again about the pathology results. Surgical alternatives were discussed with patient including excisional biopsy. Surgical technique and post operative care was discussed with patient. Risk of surgery was discussed with patient including but not limited to: wound infection, seroma, hematoma, brachial plexopathy, mondor's disease (thrombosis of small veins of breast), chronic wound pain, breast lymphedema, altered sensation  to the nipple and cosmesis among others.   Patient was oriented about the diagnosis of atypical ductal hyperplasia.  She was oriented that she is at increased risk of breast cancer compared to the normal population.  The goal of the excisional biopsy is to rule out current malignancy despite negative on core biopsy.  Patient oriented that there is a 10 to 20% chances of upgrade to malignancy.  Patient reports he understood and agreed to proceed with excisional biopsy.  Atypical ductal hyperplasia of left breast [N60.92]  PLAN: Left breast radiofrequency tagged excisional biopsy CBC, CMP Avoid taking aspirin 5 days before surgery Contact us if you have any concern  Patient verbalized understanding, all questions were answered, and were agreeable with the plan outlined above.     Herbert Pun, MD  Electronically signed by Herbert Pun, MD

## 2021-08-09 NOTE — H&P (Signed)
PATIENT PROFILE: Maria Nixon is a 41 y.o. female who presents to the Clinic for consultation at the request of Dr. 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  PCP:  Lovie Macadamia, MD  HISTORY OF PRESENT ILLNESS: Maria Nixon reports she had a screening mammogram and she was found with a distortion.  This led to diagnostic mammogram and ultrasound.  Diagnostic mammogram and ultrasound shows persistent distortion despite multiple views.  Area of distortion measured around 1 cm.  She had core biopsy of the left breast distortion.  Core biopsy shows atypical ductal hyperplasia.  I personally evaluated the images of the mammogram and ultrasound.  Family history of breast cancer: None Menarche: Around 41 years old Last menstrual period: On 2019 after hysterectomy due to heavy bleeding Used estrogen and progesterone therapy: None History of Radiation to the chest: None Number of pregnancies: 3 Age of first pregnancy: 42 (miscarriage).  Age of first complete pregnancy 41 year old  PROBLEM LIST: Problem List  Date Reviewed: 11/25/2020  None   GENERAL REVIEW OF SYSTEMS:   General ROS: negative for - chills, fatigue, fever, weight gain or weight loss Allergy and Immunology ROS: negative for - hives  Hematological and Lymphatic ROS: negative for - bleeding problems or bruising, negative for palpable nodes Endocrine ROS: negative for - heat or cold intolerance, hair changes Respiratory ROS: negative for - cough, shortness of breath or wheezing Cardiovascular ROS: no chest pain or palpitations GI ROS: negative for nausea, vomiting, abdominal pain, diarrhea, constipation Musculoskeletal ROS: negative for - joint swelling or muscle pain Neurological ROS: negative for - confusion, syncope Dermatological ROS: negative for pruritus and rash Psychiatric: negative for anxiety, depression, difficulty sleeping and memory loss  MEDICATIONS: Current Outpatient  Medications  Medication Sig Dispense Refill   brompheniramine-pseudoephed-DM (BROMFED DM) 2-30-10 mg/5 mL syrup Take 5 mLs by mouth every 6 (six) hours as needed (Patient not taking: Reported on 02/04/2020) 118 mL 0   ergocalciferol, vitamin D2, 1,250 mcg (50,000 unit) capsule Take 1 capsule (50,000 Units total) by mouth once a week (Patient not taking: Reported on 08/29/2020) 12 capsule 3   fluticasone propionate (FLONASE) 50 mcg/actuation nasal spray Place 2 sprays into both nostrils once daily (Patient not taking: Reported on 02/04/2020) 16 g 1   IBUPROFEN ORAL Take by mouth (Patient not taking: Reported on 08/29/2020)     melatonin 10 mg Cap Take by mouth (Patient not taking: Reported on 08/29/2020)     phentermine (ADIPEX-P) 37.5 MG capsule Take by mouth (Patient not taking: Reported on 11/25/2020)     No current facility-administered medications for this visit.    ALLERGIES: Patient has no known allergies.  PAST MEDICAL HISTORY: Past Medical History:  Diagnosis Date   DVT (deep venous thrombosis) (CMS-HCC) as teenager   due to [facility] and smoking   Menorrhagia     PAST SURGICAL HISTORY: Past Surgical History:  Procedure Laterality Date   TUBAL LIGATION  2012   TVH with bilateral salpingectomy  10/25/2017   CESAREAN SECTION     1     FAMILY HISTORY: Family History  Problem Relation Age of Onset   No Known Problems Mother    No Known Problems Father    Diabetes type II Maternal Grandmother    Heart failure Maternal Grandfather    Parkinsonism Paternal Grandfather      SOCIAL HISTORY: Social History   Socioeconomic History   Marital status: Married    Spouse name: Erlene Quan  Number of children: 2  Tobacco Use   Smoking status: Former    Types: Cigarettes    Quit date: 08/06/2006    Years since quitting: 15.0   Smokeless tobacco: Never  Vaping Use   Vaping Use: Never used  Substance and Sexual Activity   Alcohol use: Yes    Alcohol/week: 0.0 standard drinks    Comment:  social   Drug use: No   Sexual activity: Yes    Partners: Male    Birth control/protection: Surgical, None    PHYSICAL EXAM: Vitals:   08/08/21 1113  BP: (!) 140/83  Pulse: 83   Body mass index is 28.87 kg/m. Weight: 73.9 kg (163 lb)   GENERAL: Alert, active, oriented x3  HEENT: Pupils equal reactive to light. Extraocular movements are intact. Sclera clear. Palpebral conjunctiva normal red color.Pharynx clear.  NECK: Supple with no palpable mass and no adenopathy.  LUNGS: Sound clear with no rales rhonchi or wheezes.  HEART: Regular rhythm S1 and S2 without murmur.  BREAST: breasts appear normal, no suspicious masses, no skin or nipple changes or axillary nodes.  ABDOMEN: Soft and depressible, nontender with no palpable mass, no hepatomegaly.  EXTREMITIES: Well-developed well-nourished symmetrical with no dependent edema.  NEUROLOGICAL: Awake alert oriented, facial expression symmetrical, moving all extremities.  REVIEW OF DATA: I have reviewed the following data today: Initial consult on 08/08/2021  Component Date Value   Glucose 08/08/2021 99    Sodium 08/08/2021 138    Potassium 08/08/2021 4.4    Chloride 08/08/2021 107    Carbon Dioxide (CO2) 08/08/2021 21.9 (L)    Urea Nitrogen (BUN) 08/08/2021 11    Creatinine 08/08/2021 0.7    Glomerular Filtration Ra* 08/08/2021 92    Calcium 08/08/2021 9.2    AST  08/08/2021 16    ALT  08/08/2021 18    Alk Phos (alkaline Phosp* 08/08/2021 59    Albumin 08/08/2021 4.4    Bilirubin, Total 08/08/2021 0.4    Protein, Total 08/08/2021 6.7    A/G Ratio 08/08/2021 1.9    WBC (White Blood Cell Co* 08/08/2021 4.7    RBC (Red Blood Cell Coun* 08/08/2021 4.14    Hemoglobin 08/08/2021 12.8    Hematocrit 08/08/2021 37.7    MCV (Mean Corpuscular Vo* 08/08/2021 91.1    MCH (Mean Corpuscular He* 08/08/2021 30.9    MCHC (Mean Corpuscular H* 08/08/2021 34.0    Platelet Count 08/08/2021 316    RDW-CV (Red Cell Distrib* 08/08/2021  12.5    MPV (Mean Platelet Volum* 08/08/2021 9.7    Neutrophils 08/08/2021 2.74    Lymphocytes 08/08/2021 1.42    Monocytes 08/08/2021 0.35    Eosinophils 08/08/2021 0.09    Basophils 08/08/2021 0.05    Neutrophil % 08/08/2021 58.8    Lymphocyte % 08/08/2021 30.5    Monocyte % 08/08/2021 7.5    Eosinophil % 08/08/2021 1.9    Basophil% 08/08/2021 1.1    Immature Granulocyte % 08/08/2021 0.2    Immature Granulocyte Cou* 08/08/2021 0.01      ASSESSMENT: Ms. Arenas is a 41 y.o. female presenting for consultation for left breast atypical hyperplasia.    Patient was oriented again about the pathology results. Surgical alternatives were discussed with patient including excisional biopsy. Surgical technique and post operative care was discussed with patient. Risk of surgery was discussed with patient including but not limited to: wound infection, seroma, hematoma, brachial plexopathy, mondor's disease (thrombosis of small veins of breast), chronic wound pain, breast lymphedema, altered sensation  to the nipple and cosmesis among others.   Patient was oriented about the diagnosis of atypical ductal hyperplasia.  She was oriented that she is at increased risk of breast cancer compared to the normal population.  The goal of the excisional biopsy is to rule out current malignancy despite negative on core biopsy.  Patient oriented that there is a 10 to 20% chances of upgrade to malignancy.  Patient reports he understood and agreed to proceed with excisional biopsy.  Atypical ductal hyperplasia of left breast [N60.92]  PLAN: Left breast radiofrequency tagged excisional biopsy CBC, CMP Avoid taking aspirin 5 days before surgery Contact us if you have any concern  Patient verbalized understanding, all questions were answered, and were agreeable with the plan outlined above.     Herbert Pun, MD  Electronically signed by Herbert Pun, MD

## 2021-08-29 ENCOUNTER — Encounter
Admission: RE | Admit: 2021-08-29 | Discharge: 2021-08-29 | Disposition: A | Discharge status: Home / Self Care | Payer: Managed Care, Other (non HMO) | Source: Ambulatory Visit | Attending: General Surgery | Admitting: General Surgery

## 2021-08-29 ENCOUNTER — Other Ambulatory Visit: Payer: Self-pay

## 2021-08-29 NOTE — Patient Instructions (Addendum)
Your procedure is scheduled on: Friday 09/08/21 Report to Day Surgery.  [facility] desk in [facility] To find out your arrival time please call [phone] between 1PM - 3PM on Thurs 2/2.  Remember: Instructions that are not followed completely may result in serious medical risk,  up to and including death, or upon the discretion of your surgeon and anesthesiologist your  surgery may need to be rescheduled.     _X__ 1. Do not eat food after midnight the night before your procedure.                 No chewing gum or hard candies. You may drink clear liquids up to 2 hours                 before you are scheduled to arrive for your surgery- DO not drink clear                 liquids within 2 hours of the start of your surgery.                 Clear Liquids include:  water, apple juice without pulp, clear Gatorade, G2 or                  Gatorade Zero (avoid Red/Purple/Blue), Black Coffee or Tea (Do not add                 anything to coffee or tea). __X__2.  On the morning of surgery brush your teeth with toothpaste and water, you                may rinse your mouth with mouthwash if you wish.  Do not swallow any toothpaste of mouthwash.     _X__ 3.  No Alcohol for 24 hours before or after surgery.   __ 4.  Do Not Smoke or use e-cigarettes For 24 Hours Prior to Your Surgery.                 Do not use any chewable tobacco products for at least 6 hours prior to                 Surgery.  ___  5.  Do not use any recreational drugs (marijuana, cocaine, heroin, ecstasy,  MDMA or other) For at least one week prior to your surgery.   Combination of these drugs with anesthesia may have life threatening  results.  ____  6.  Bring all medications with you on the day of surgery if instructed.   __x__  7.  Notify your doctor if there is any change in your medical condition      (cold, fever, infections).     Do not wear jewelry, make-up, hairpins, clips or nail  polish. Do not wear lotions, powders, or perfumes.  Do not shave 48 hours prior to surgery. Men may shave face and neck. Do not bring valuables to the hospital.    [facility] is not responsible for any belongings or valuables.  Contacts, dentures or bridgework may not be worn into surgery. Leave your suitcase in the car. After surgery it may be brought to your room. For patients admitted to the hospital, discharge time is determined by your treatment team.   Patients discharged the day of surgery will not be allowed to drive home.   Make arrangements for someone to be with you for the first 24 hours of your Same Day Discharge.  Please read over the following fact sheets that you were given:       _x___ Take these medicines the morning of surgery with A SIP OF WATER:    1. none  2.   3.   4.  5.  6.  ____ Fleet Enema (as directed)   __x__  Shower the night before and morning of surgery  ____ Use Benzoyl Peroxide Gel as instructed  ____ Use inhalers on the day of surgery  ____ Stop metformin 2 days prior to surgery    ____ Take 1/2 of usual insulin dose the night before surgery. No insulin the morning          of surgery.   ____ Stop Coumadin/Plavix/aspirin on   __x__ Stop Anti-inflammatories no ibuprofen aleve or aspirin products for 1 week prior to the procedure   __x__ Stop supplements   melatonin 5 MG TABS 1 week prior to surgery  ____ Bring C-Pap to the hospital.    If you have any questions regarding your pre-procedure instructions,  Please call Pre-admit Testing at [phone]

## 2021-09-05 ENCOUNTER — Other Ambulatory Visit: Payer: Self-pay

## 2021-09-05 ENCOUNTER — Ambulatory Visit
Admission: RE | Admit: 2021-09-05 | Discharge: 2021-09-05 | Disposition: A | Discharge status: Home / Self Care | Payer: Managed Care, Other (non HMO) | Source: Ambulatory Visit | Attending: General Surgery | Admitting: General Surgery

## 2021-09-05 DIAGNOSIS — N6092 Unspecified benign mammary dysplasia of left breast: Secondary | ICD-10-CM | POA: Diagnosis present

## 2021-09-08 ENCOUNTER — Ambulatory Visit: Payer: Managed Care, Other (non HMO) | Admitting: Certified Registered Nurse Anesthetist

## 2021-09-08 ENCOUNTER — Encounter
Admission: RE | Disposition: A | Discharge status: Home / Self Care | Payer: Self-pay | Source: Home / Self Care | Attending: General Surgery

## 2021-09-08 ENCOUNTER — Other Ambulatory Visit: Payer: Self-pay

## 2021-09-08 ENCOUNTER — Encounter: Payer: Self-pay | Admitting: General Surgery

## 2021-09-08 ENCOUNTER — Ambulatory Visit
Admission: RE | Admit: 2021-09-08 | Discharge: 2021-09-08 | Disposition: A | Discharge status: Home / Self Care | Payer: Managed Care, Other (non HMO) | Source: Ambulatory Visit | Attending: General Surgery | Admitting: General Surgery

## 2021-09-08 ENCOUNTER — Ambulatory Visit
Admission: RE | Admit: 2021-09-08 | Discharge: 2021-09-08 | Disposition: A | Discharge status: Home / Self Care | Payer: Managed Care, Other (non HMO) | Attending: General Surgery | Admitting: General Surgery

## 2021-09-08 DIAGNOSIS — Z87891 Personal history of nicotine dependence: Secondary | ICD-10-CM | POA: Insufficient documentation

## 2021-09-08 DIAGNOSIS — N6092 Unspecified benign mammary dysplasia of left breast: Secondary | ICD-10-CM | POA: Insufficient documentation

## 2021-09-08 HISTORY — PX: EXCISION OF BREAST BIOPSY: SHX5822

## 2021-09-08 SURGERY — EXCISION OF BREAST BIOPSY
Anesthesia: General | Site: Breast | Laterality: Left

## 2021-09-08 MED ORDER — FAMOTIDINE 20 MG PO TABS
ORAL_TABLET | ORAL | Status: AC
  Administered 2021-09-08: 20 mg via ORAL
  Filled 2021-09-08: qty 1

## 2021-09-08 MED ORDER — HYDROCODONE-ACETAMINOPHEN 5-325 MG PO TABS: 1.0000 | ORAL_TABLET | ORAL | 0 refills | Status: AC | PRN

## 2021-09-08 MED ORDER — APREPITANT 40 MG PO CAPS
40.0000 mg | ORAL_CAPSULE | Freq: Once | ORAL | Status: AC
Start: 2021-09-08 — End: 2021-09-08

## 2021-09-08 MED ORDER — MIDAZOLAM HCL 2 MG/2ML IJ SOLN
INTRAMUSCULAR | Status: AC
  Filled 2021-09-08: qty 2

## 2021-09-08 MED ORDER — PROPOFOL 10 MG/ML IV BOLUS
INTRAVENOUS | Status: DC | PRN
  Administered 2021-09-08: 200 mg via INTRAVENOUS

## 2021-09-08 MED ORDER — BUPIVACAINE-EPINEPHRINE (PF) 0.5% -1:200000 IJ SOLN
INTRAMUSCULAR | Status: DC | PRN
  Administered 2021-09-08: 30 mL

## 2021-09-08 MED ORDER — LACTATED RINGERS IV SOLN: INTRAVENOUS | Status: DC

## 2021-09-08 MED ORDER — FENTANYL CITRATE (PF) 100 MCG/2ML IJ SOLN: 25.0000 ug | INTRAMUSCULAR | Status: DC | PRN

## 2021-09-08 MED ORDER — DEXAMETHASONE SODIUM PHOSPHATE 10 MG/ML IJ SOLN
INTRAMUSCULAR | Status: DC | PRN
  Administered 2021-09-08: 10 mg via INTRAVENOUS

## 2021-09-08 MED ORDER — HYDROCODONE-ACETAMINOPHEN 5-325 MG PO TABS: 1.0000 | ORAL_TABLET | ORAL | 0 refills | Status: DC | PRN

## 2021-09-08 MED ORDER — OXYCODONE HCL 5 MG PO TABS
5.0000 mg | ORAL_TABLET | Freq: Once | ORAL | Status: AC | PRN
  Administered 2021-09-08: 5 mg via ORAL

## 2021-09-08 MED ORDER — APREPITANT 40 MG PO CAPS
ORAL_CAPSULE | ORAL | Status: AC
  Administered 2021-09-08: 40 mg via ORAL
  Filled 2021-09-08: qty 1

## 2021-09-08 MED ORDER — OXYCODONE HCL 5 MG PO TABS
ORAL_TABLET | ORAL | Status: AC
  Filled 2021-09-08: qty 1

## 2021-09-08 MED ORDER — ALBUTEROL SULFATE HFA 108 (90 BASE) MCG/ACT IN AERS
INHALATION_SPRAY | RESPIRATORY_TRACT | Status: DC | PRN
  Administered 2021-09-08: 4 via RESPIRATORY_TRACT

## 2021-09-08 MED ORDER — CHLORHEXIDINE GLUCONATE 0.12 % MT SOLN
OROMUCOSAL | Status: AC
  Administered 2021-09-08: 15 mL via OROMUCOSAL
  Filled 2021-09-08: qty 15

## 2021-09-08 MED ORDER — FENTANYL CITRATE (PF) 100 MCG/2ML IJ SOLN
INTRAMUSCULAR | Status: AC
  Filled 2021-09-08: qty 2

## 2021-09-08 MED ORDER — ORAL CARE MOUTH RINSE: 15.0000 mL | Freq: Once | OROMUCOSAL | Status: AC

## 2021-09-08 MED ORDER — CEFAZOLIN SODIUM-DEXTROSE 2-4 GM/100ML-% IV SOLN
INTRAVENOUS | Status: AC
  Filled 2021-09-08: qty 100

## 2021-09-08 MED ORDER — CHLORHEXIDINE GLUCONATE 0.12 % MT SOLN: 15.0000 mL | Freq: Once | OROMUCOSAL | Status: AC

## 2021-09-08 MED ORDER — ACETAMINOPHEN 10 MG/ML IV SOLN
INTRAVENOUS | Status: DC | PRN
  Administered 2021-09-08: 1000 mg via INTRAVENOUS

## 2021-09-08 MED ORDER — OXYCODONE HCL 5 MG/5ML PO SOLN: 5.0000 mg | Freq: Once | ORAL | Status: AC | PRN

## 2021-09-08 MED ORDER — ONDANSETRON HCL 4 MG/2ML IJ SOLN
INTRAMUSCULAR | Status: DC | PRN
  Administered 2021-09-08: 4 mg via INTRAVENOUS

## 2021-09-08 MED ORDER — FAMOTIDINE 20 MG PO TABS: 20.0000 mg | ORAL_TABLET | Freq: Once | ORAL | Status: AC

## 2021-09-08 MED ORDER — ONDANSETRON HCL 4 MG/2ML IJ SOLN
INTRAMUSCULAR | Status: AC
  Filled 2021-09-08: qty 2

## 2021-09-08 MED ORDER — LIDOCAINE HCL (PF) 2 % IJ SOLN
INTRAMUSCULAR | Status: AC
  Filled 2021-09-08: qty 5

## 2021-09-08 MED ORDER — BUPIVACAINE-EPINEPHRINE (PF) 0.5% -1:200000 IJ SOLN
INTRAMUSCULAR | Status: AC
  Filled 2021-09-08: qty 30

## 2021-09-08 MED ORDER — MIDAZOLAM HCL 2 MG/2ML IJ SOLN
INTRAMUSCULAR | Status: DC | PRN
  Administered 2021-09-08: 2 mg via INTRAVENOUS

## 2021-09-08 MED ORDER — PROPOFOL 10 MG/ML IV BOLUS
INTRAVENOUS | Status: AC
  Filled 2021-09-08: qty 20

## 2021-09-08 MED ORDER — DEXAMETHASONE SODIUM PHOSPHATE 10 MG/ML IJ SOLN
INTRAMUSCULAR | Status: AC
  Filled 2021-09-08: qty 1

## 2021-09-08 MED ORDER — FENTANYL CITRATE (PF) 100 MCG/2ML IJ SOLN
INTRAMUSCULAR | Status: DC | PRN
Start: 2021-09-08 — End: 2021-09-08
  Administered 2021-09-08 (×2): 50 ug via INTRAVENOUS

## 2021-09-08 MED ORDER — CEFAZOLIN SODIUM-DEXTROSE 2-4 GM/100ML-% IV SOLN
2.0000 g | INTRAVENOUS | Status: AC
  Administered 2021-09-08: 2 g via INTRAVENOUS

## 2021-09-08 MED ORDER — LIDOCAINE HCL (CARDIAC) PF 100 MG/5ML IV SOSY
PREFILLED_SYRINGE | INTRAVENOUS | Status: DC | PRN
  Administered 2021-09-08: 100 mg via INTRAVENOUS

## 2021-09-08 MED ORDER — ACETAMINOPHEN 10 MG/ML IV SOLN
INTRAVENOUS | Status: AC
  Filled 2021-09-08: qty 100

## 2021-09-08 SURGICAL SUPPLY — 47 items
ADH SKN CLS APL DERMABOND .7 (GAUZE/BANDAGES/DRESSINGS) ×1
APL PRP STRL LF DISP 70% ISPRP (MISCELLANEOUS) ×1
BLADE SURG 15 STRL LF DISP TIS (BLADE) ×1 IMPLANT
BLADE SURG 15 STRL SS (BLADE) ×2
CHLORAPREP W/TINT 26 (MISCELLANEOUS) ×2 IMPLANT
CNTNR SPEC 2.5X3XGRAD LEK (MISCELLANEOUS)
CONT SPEC 4OZ STER OR WHT (MISCELLANEOUS)
CONT SPEC 4OZ STRL OR WHT (MISCELLANEOUS)
CONTAINER SPEC 2.5X3XGRAD LEK (MISCELLANEOUS) ×1 IMPLANT
DERMABOND ADVANCED (GAUZE/BANDAGES/DRESSINGS) ×1
DERMABOND ADVANCED .7 DNX12 (GAUZE/BANDAGES/DRESSINGS) ×1 IMPLANT
DEVICE DUBIN SPECIMEN MAMMOGRA (MISCELLANEOUS) ×2 IMPLANT
DRAPE LAPAROTOMY TRNSV 106X77 (MISCELLANEOUS) ×2 IMPLANT
ELECT CAUTERY BLADE TIP 2.5 (TIP) ×2
ELECT REM PT RETURN 9FT ADLT (ELECTROSURGICAL) ×2
ELECTRODE CAUTERY BLDE TIP 2.5 (TIP) ×1 IMPLANT
ELECTRODE REM PT RTRN 9FT ADLT (ELECTROSURGICAL) ×1 IMPLANT
GAUZE 4X4 16PLY ~~LOC~~+RFID DBL (SPONGE) ×2 IMPLANT
GLOVE SURG ENC MOIS LTX SZ6.5 (GLOVE) ×7 IMPLANT
GLOVE SURG UNDER POLY LF SZ6.5 (GLOVE) ×7 IMPLANT
GOWN STRL REUS W/ TWL LRG LVL3 (GOWN DISPOSABLE) ×3 IMPLANT
GOWN STRL REUS W/TWL LRG LVL3 (GOWN DISPOSABLE) ×8
KIT MARKER MARGIN INK (KITS) IMPLANT
KIT TURNOVER KIT A (KITS) ×2 IMPLANT
LABEL OR SOLS (LABEL) ×2 IMPLANT
MANIFOLD NEPTUNE II (INSTRUMENTS) ×2 IMPLANT
MARGIN MAP 10MM (MISCELLANEOUS) ×1 IMPLANT
MARKER MARGIN CORRECT CLIP (MARKER) ×1 IMPLANT
NDL HYPO 25X1 1.5 SAFETY (NEEDLE) ×1 IMPLANT
NEEDLE HYPO 25X1 1.5 SAFETY (NEEDLE) ×2 IMPLANT
PACK BASIN MINOR ARMC (MISCELLANEOUS) ×2 IMPLANT
RETRACTOR RING XSMALL (MISCELLANEOUS) IMPLANT
RTRCTR WOUND ALEXIS 13CM XS SH (MISCELLANEOUS) ×2
SET LOCALIZER 20 PROBE US (MISCELLANEOUS) ×1 IMPLANT
SUT ETHILON 3-0 FS-10 30 BLK (SUTURE) ×2
SUT MNCRL 4-0 (SUTURE) ×2
SUT MNCRL 4-0 27XMFL (SUTURE) ×1
SUT SILK 2 0 SH (SUTURE) ×2 IMPLANT
SUT VIC AB 3-0 SH 27 (SUTURE) ×2
SUT VIC AB 3-0 SH 27X BRD (SUTURE) ×1 IMPLANT
SUTURE EHLN 3-0 FS-10 30 BLK (SUTURE) ×1 IMPLANT
SUTURE MNCRL 4-0 27XMF (SUTURE) ×1 IMPLANT
SYR 10ML LL (SYRINGE) ×2 IMPLANT
SYR BULB IRRIG 60ML STRL (SYRINGE) ×2 IMPLANT
TRAP NEPTUNE SPECIMEN COLLECT (MISCELLANEOUS) ×2 IMPLANT
WATER STERILE IRR 1000ML POUR (IV SOLUTION) ×2 IMPLANT
WATER STERILE IRR 500ML POUR (IV SOLUTION) ×1 IMPLANT

## 2021-09-08 NOTE — Discharge Instructions (Addendum)
°  Diet: Resume home heart healthy regular diet.   Activity: Increase activity as tolerated. Light activity and walking are encouraged. Do not drive or drink alcohol if taking narcotic pain medications.  Wound care: May shower with soapy water and pat dry (do not rub incisions), but no baths or submerging incision underwater until follow-up. (no swimming)   Medications: Resume all home medications. For mild to moderate pain: acetaminophen (Tylenol) or ibuprofen (if no kidney disease). Combining Tylenol with alcohol can substantially increase your risk of causing liver disease. Narcotic pain medications, if prescribed, can be used for severe pain, though may cause nausea, constipation, and drowsiness. Do not combine Tylenol and Norco within a 6 hour period as Norco contains Tylenol. If you do not need the narcotic pain medication, you do not need to fill the prescription.   You had tylenol 1,000 mg at the hospital at 10:39 am; may have again per manufacturer's instructions.  DO NOT TAKE TYLENOL AT THE SAME TIME AS [place] (AS IT HAS TYLENOL IN IT)  Call office [phone]) at any time if any questions, worsening pain, fevers/chills, bleeding, drainage from incision site, or other concerns.   AMBULATORY SURGERY  DISCHARGE INSTRUCTIONS   The drugs that you were given will stay in your system until tomorrow so for the next 24 hours you should not:  Drive an automobile Make any legal decisions Drink any alcoholic beverage   You may resume regular meals tomorrow.  Today it is better to start with liquids and gradually work up to solid foods.  You may eat anything you prefer, but it is better to start with liquids, then soup and crackers, and gradually work up to solid foods.   Please notify your doctor immediately if you have any unusual bleeding, trouble breathing, redness and pain at the surgery site, drainage, fever, or pain not relieved by medication.    Additional  Instructions:        Please contact your physician with any problems or Same Day Surgery at [phone], Monday through Friday 6 am to 4 pm, or [place] [facility] number at [phone].

## 2021-09-08 NOTE — Anesthesia Preprocedure Evaluation (Signed)
Anesthesia Evaluation  Patient identified by MRN, date of birth, ID band Patient awake    Reviewed: Allergy & Precautions, NPO status , Patient's Chart, lab work & pertinent test results  History of Anesthesia Complications (+) history of anesthetic complications  Airway Mallampati: I  TM Distance: >3 FB Neck ROM: full    Dental  (+) Chipped   Pulmonary neg shortness of breath, former smoker,    Pulmonary exam normal        Cardiovascular Exercise Tolerance: Good (-) angina(-) Past MI negative cardio ROS Normal cardiovascular exam     Neuro/Psych negative neurological ROS  negative psych ROS   GI/Hepatic negative GI ROS, Neg liver ROS, neg GERD  ,  Endo/Other  negative endocrine ROS  Renal/GU      Musculoskeletal   Abdominal   Peds  Hematology negative hematology ROS (+)   Anesthesia Other Findings Past Medical History: No date: Complication of anesthesia     Comment:  shakes after c section No date: Irregular periods  Past Surgical History: 10/25/2017: BILATERAL SALPINGECTOMY; Bilateral     Comment:  Procedure: BILATERAL SALPINGECTOMY;  Surgeon:               Suzy Bouchard, MD;  Location: [facility] ORS;                Service: Gynecology;  Laterality: Bilateral; 07/25/2021: BREAST BIOPSY; Left     Comment:  stereo bx, x marker, path pending No date: CESAREAN SECTION No date: TUBAL LIGATION 10/25/2017: VAGINAL HYSTERECTOMY; N/A     Comment:  Procedure: HYSTERECTOMY VAGINAL;  Surgeon: Suzy Bouchard, MD;  Location: [facility] ORS;  Service: Gynecology;               Laterality: N/A;     Reproductive/Obstetrics negative OB ROS                             Anesthesia Physical Anesthesia Plan  ASA: 2  Anesthesia Plan: General LMA   Post-op Pain Management:    Induction: Intravenous  PONV Risk Score and Plan: Dexamethasone, Ondansetron, Midazolam and  Treatment may vary due to age or medical condition  Airway Management Planned: LMA  Additional Equipment:   Intra-op Plan:   Post-operative Plan: Extubation in OR  Informed Consent: I have reviewed the patients History and Physical, chart, labs and discussed the procedure including the risks, benefits and alternatives for the proposed anesthesia with the patient or authorized representative who has indicated his/her understanding and acceptance.     Dental Advisory Given  Plan Discussed with: Anesthesiologist, CRNA and Surgeon  Anesthesia Plan Comments: (Patient consented for risks of anesthesia including but not limited to:  - adverse reactions to medications - damage to eyes, teeth, lips or other oral mucosa - nerve damage due to positioning  - sore throat or hoarseness - Damage to heart, brain, nerves, lungs, other parts of body or loss of life  Patient voiced understanding.)        Anesthesia Quick Evaluation

## 2021-09-08 NOTE — Anesthesia Procedure Notes (Signed)
Procedure Name: LMA Insertion Date/Time: 09/08/2021 10:05 AM Performed by: Johnna Acosta, CRNA Pre-anesthesia Checklist: Patient identified, Emergency Drugs available, Suction available, Patient being monitored and Timeout performed Patient Re-evaluated:Patient Re-evaluated prior to induction Oxygen Delivery Method: Circle system utilized Preoxygenation: Pre-oxygenation with 100% oxygen Induction Type: IV induction LMA: LMA inserted LMA Size: 4.0 Tube type: Oral Number of attempts: 1 Placement Confirmation: positive ETCO2 and breath sounds checked- equal and bilateral Tube secured with: Tape Dental Injury: Teeth and Oropharynx as per pre-operative assessment

## 2021-09-08 NOTE — Op Note (Signed)
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   Procedure: Left radiofrequency tag-localized excisional biopsy.                       Anesthesia: GETA  Surgeon: Dr. Hazle Quant  Wound Classification: Clean  Indications: [name] with a nonpalpable left breast mass noted on mammography with core biopsy demonstrating atypical ductal hyperplasia requires radiofrequency tag-localized partial mastectomy to rule out malignancy.   Findings: 1. Specimen mammography shows marker on specimen  Description of procedure: Preoperative radiofrequency tag localization was performed by radiology. Localization studies were reviewed. The patient was taken to the operating room and placed supine on the operating table, and after general anesthesia the left chest and axilla were prepped and draped in the usual sterile fashion. A time-out was completed verifying correct patient, procedure, site, positioning, and implant(s) and/or special equipment prior to beginning this procedure.  By comparing the localization studies and interrogation with Localizer device, the probable trajectory and location of the mass was visualized. A circumareolar skin incision was planned in such a way as to minimize the amount of dissection to reach the mass.  The skin incision was made. Flaps were raised and the location of the tag was confirmed with Localizer device confirmed. A 2-0 silk figure-of-eight stay suture was placed and used for retraction. Dissection was then taken down circumferentially, taking care to include the entire localizing tag and a wide margin of grossly normal tissue. The specimen and entire localizing tag were removed. The specimen was oriented and sent to radiology with the localization studies. Confirmation was received that the entire target lesion had been resected. The wound was irrigated. Hemostasis was checked. The wound was closed with  interrupted sutures of 3-0 Vicryl and a subcuticular suture of Monocryl 4-0. No attempt was made to close the dead space.   The patient tolerated the procedure well and was taken to the postanesthesia care unit in stable condition.    Specimen: Left Breast excisional biopsy                     Complications: None  Estimated Blood Loss: 5 mL

## 2021-09-08 NOTE — Anesthesia Postprocedure Evaluation (Signed)
Anesthesia Post Note  Patient: Maria Nixon  Procedure(s) Performed: EXCISION OF BREAST BIOPSY w/ RF tag (Left: Breast)  Patient location during evaluation: PACU Anesthesia Type: General Level of consciousness: awake and alert Pain management: pain level controlled Vital Signs Assessment: post-procedure vital signs reviewed and stable Respiratory status: spontaneous breathing, nonlabored ventilation, respiratory function stable and patient connected to nasal cannula oxygen Cardiovascular status: blood pressure returned to baseline and stable Postop Assessment: no apparent nausea or vomiting Anesthetic complications: no   No notable events documented.   Last Vitals:  Vitals:   09/08/21 1203 09/08/21 1239  BP: 131/75 110/72  Pulse: 73 73  Resp: 16 16  Temp: (!) 36.2 C   SpO2: 100% 98%    Last Pain:  Vitals:   09/08/21 1239  TempSrc: Temporal  PainSc: 2                  Cleda Mccreedy Arvie Bartholomew

## 2021-09-08 NOTE — Interval H&P Note (Signed)
History and Physical Interval Note:  09/08/2021 9:25 AM  Maria Nixon  has presented today for surgery, with the diagnosis of N60.92 Atypical ductal hyperplasia of lt breast.  The various methods of treatment have been discussed with the patient and family. After consideration of risks, benefits and other options for treatment, the patient has consented to  Procedure(s): EXCISION OF BREAST BIOPSY w/ RF tag (Left) as a surgical intervention.  The patient's history has been reviewed, patient examined, no change in status, stable for surgery.  I have reviewed the patient's chart and labs.  Questions were answered to the patient's satisfaction.     Carolan Shiver

## 2021-09-08 NOTE — Transfer of Care (Signed)
Immediate Anesthesia Transfer of Care Note  Patient: Maria Nixon  Procedure(s) Performed: EXCISION OF BREAST BIOPSY w/ RF tag (Left: Breast)  Patient Location: PACU  Anesthesia Type:General  Level of Consciousness: awake  Airway & Oxygen Therapy: Patient Spontanous Breathing and Patient connected to nasal cannula oxygen  Post-op Assessment: Report given to RN  Post vital signs: stable  Last Vitals:  Vitals Value Taken Time  BP 107/57 09/08/21 1106  Temp 36.1 C 09/08/21 1105  Pulse 78 09/08/21 1108  Resp 14 09/08/21 1108  SpO2 98 % 09/08/21 1108  Vitals shown include unvalidated device data.  Last Pain:  Vitals:   09/08/21 0902  TempSrc: Temporal  PainSc: 0-No pain         Complications: No notable events documented.

## 2021-09-11 LAB — SURGICAL PATHOLOGY

## 2021-09-19 ENCOUNTER — Encounter: Payer: Self-pay | Admitting: *Deleted

## 2021-09-27 ENCOUNTER — Other Ambulatory Visit: Payer: Self-pay

## 2021-09-27 ENCOUNTER — Encounter: Payer: Self-pay | Admitting: Oncology

## 2021-09-27 ENCOUNTER — Inpatient Hospital Stay: Payer: Managed Care, Other (non HMO)

## 2021-09-27 ENCOUNTER — Inpatient Hospital Stay: Payer: Managed Care, Other (non HMO) | Attending: Oncology | Admitting: Oncology

## 2021-09-27 VITALS — BP 137/75 | HR 87 | Temp 97.0°F | Resp 18 | Wt 176.9 lb

## 2021-09-27 DIAGNOSIS — Z87891 Personal history of nicotine dependence: Secondary | ICD-10-CM

## 2021-09-27 DIAGNOSIS — N6099 Unspecified benign mammary dysplasia of unspecified breast: Secondary | ICD-10-CM

## 2021-09-27 DIAGNOSIS — Z9079 Acquired absence of other genital organ(s): Secondary | ICD-10-CM

## 2021-09-27 DIAGNOSIS — Z90710 Acquired absence of both cervix and uterus: Secondary | ICD-10-CM

## 2021-09-27 DIAGNOSIS — Z803 Family history of malignant neoplasm of breast: Secondary | ICD-10-CM | POA: Diagnosis not present

## 2021-09-27 DIAGNOSIS — Z809 Family history of malignant neoplasm, unspecified: Secondary | ICD-10-CM

## 2021-09-27 DIAGNOSIS — N6082 Other benign mammary dysplasias of left breast: Secondary | ICD-10-CM | POA: Diagnosis not present

## 2021-09-27 NOTE — Progress Notes (Signed)
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  HISTORY OF PRESENTING ILLNESS:   Maria Nixon is a  41 y.o.  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  06/22/2021, bilateral screening mammogram showed possible mass in the left breast. 07/07/2021, left breast diagnostic mammogram showed 1.1 cm left breast mass.  Ultrasound of the left axilla is negative. 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  09/08/2021, patient underwent excisional biopsy by Dr. Peyton Najjar. Pathology showed atypical ductal hyperplasia.  No evidence of invasive or in situ carcinoma.  Margin is free of ADH.  Patient was referred to establish care with oncology for further evaluation of chemoprevention. Family history is positive for grade aunt with breast cancer.  No other family history of cancer.  Patient has a history of hysterectomy.   Review of Systems  Constitutional:  Negative for appetite change, chills, fatigue and fever.  HENT:   Negative for hearing loss and voice change.   Eyes:  Negative for eye problems.  Respiratory:  Negative for chest tightness and cough.   Cardiovascular:  Negative for chest pain.  Gastrointestinal:  Negative for abdominal distention, abdominal pain and blood in stool.  Endocrine: Negative for hot flashes.  Genitourinary:  Negative for difficulty  urinating and frequency.   Musculoskeletal:  Negative for arthralgias.  Skin:  Negative for itching and rash.  Neurological:  Negative for extremity weakness.  Hematological:  Negative for adenopathy.  Psychiatric/Behavioral:  Negative for confusion.    MEDICAL HISTORY:  Past Medical History:  Diagnosis Date   Breast ductal hyperplasia, atypical    Complication of anesthesia    shakes after c section   Irregular periods     SURGICAL HISTORY: Past Surgical History:  Procedure Laterality Date   BILATERAL SALPINGECTOMY Bilateral 10/25/2017   Procedure: BILATERAL SALPINGECTOMY;  Surgeon: Schermerhorn, Gwen Her, MD;  Location: [facility];  Service: Gynecology;  Laterality: Bilateral;   BREAST BIOPSY Left 07/25/2021   stereo bx, x marker, path pending   CESAREAN SECTION     EXCISION OF BREAST BIOPSY Left 09/08/2021   Procedure: EXCISION OF BREAST BIOPSY w/ RF tag;  Surgeon: Herbert Pun, MD;  Location: [facility];  Service: General;  Laterality: Left;   TUBAL LIGATION     VAGINAL HYSTERECTOMY N/A 10/25/2017   Procedure: HYSTERECTOMY VAGINAL;  Surgeon: Boykin Nearing, MD;  Location: [facility];  Service: Gynecology;  Laterality: N/A;    SOCIAL HISTORY: Social History   Socioeconomic History   Marital status: Married    Spouse name: Not on file   Number of children: Not on file   Years of education: Not on file   Highest education level: Not on file  Occupational History   Not on file  Tobacco Use   Smoking status: Former    Packs/day: 0.50    Years: 2.00    Pack years: 1.00    Types: Cigarettes    Quit date: 08/09/2006    Years since quitting:  15.1   Smokeless tobacco: Never  Vaping Use   Vaping Use: Never used  Substance and Sexual Activity   Alcohol use: Yes    Comment: SOCIAL   Drug use: No   Sexual activity: Yes    Partners: Male    Birth control/protection: None  Other Topics Concern   Not on file  Social History Narrative   Not on file   Social  Determinants of Health   Financial Resource Strain: Not on file  Food Insecurity: Not on file  Transportation Needs: Not on file  Physical Activity: Not on file  Stress: Not on file  Social Connections: Not on file  Intimate Partner Violence: Not on file    FAMILY HISTORY: Family History  Problem Relation Age of Onset   Hypertension Mother    Rashes / Skin problems Mother    Psoriasis Mother    Hypertension Father    Vitamin D deficiency Father    Parkinson's disease Paternal Uncle    Diabetes Maternal Grandmother    Heart failure Maternal Grandfather    Stroke Paternal Grandmother    Parkinson's disease Paternal Grandfather    Parkinsonism Paternal Grandfather     ALLERGIES:  is allergic to other.  MEDICATIONS:  Current Outpatient Medications  Medication Sig Dispense Refill   cyanocobalamin (,VITAMIN B-12,) 1000 MCG/ML injection Inject into the muscle.     melatonin 5 MG TABS Take 30 mg by mouth at bedtime. gummies     NON FORMULARY Lipo den 1 mL given IM in office every month     phentermine (ADIPEX-P) 37.5 MG tablet Take 37.5 mg by mouth every morning.     No current facility-administered medications for this visit.     PHYSICAL EXAMINATION: ECOG PERFORMANCE STATUS: 0 - Asymptomatic Vitals:   09/27/21 0934  BP: 137/75  Pulse: 87  Resp: 18  Temp: (!) 97 F (36.1 C)   Filed Weights   09/27/21 0934  Weight: 176 lb 14.4 oz (80.2 kg)    Physical Exam Constitutional:      General: She is not in acute distress. HENT:     Head: Normocephalic and atraumatic.  Eyes:     General: No scleral icterus. Cardiovascular:     Rate and Rhythm: Normal rate and regular rhythm.     Heart sounds: Normal heart sounds.  Pulmonary:     Effort: Pulmonary effort is normal. No respiratory distress.     Breath sounds: No wheezing.  Abdominal:     General: Bowel sounds are normal. There is no distension.     Palpations: Abdomen is soft.  Musculoskeletal:        General:  No deformity. Normal range of motion.     Cervical back: Normal range of motion and neck supple.  Skin:    General: Skin is warm and dry.     Findings: No erythema or rash.  Neurological:     Mental Status: She is alert and oriented to person, place, and time. Mental status is at baseline.     Cranial Nerves: No cranial nerve deficit.     Coordination: Coordination normal.  Psychiatric:        Mood and Affect: Mood normal.  Patient declined breast examination.  LABORATORY DATA:  I have reviewed the data as listed Lab Results  Component Value Date   WBC 8.3 10/26/2017   HGB 10.0 (L) 10/26/2017   HCT 29.5 (L) 10/26/2017   MCV 87.3 10/26/2017   PLT 222 10/26/2017  No results for input(s): NA, K, CL, CO2, GLUCOSE, BUN, CREATININE, CALCIUM, GFRNONAA, GFRAA, PROT, ALBUMIN, AST, ALT, ALKPHOS, BILITOT, BILIDIR, IBILI in the last 8760 hours. Iron/TIBC/Ferritin/ %Sat No results found for: IRON, TIBC, FERRITIN, IRONPCTSAT    RADIOGRAPHIC STUDIES: I have personally reviewed the radiological images as listed and agreed with the findings in the report. MM Breast Surgical Specimen  Result Date: 09/08/2021 CLINICAL DATA:  Evaluate specimen EXAM: SPECIMEN RADIOGRAPH OF THE LEFT BREAST COMPARISON:  Previous exam(s). FINDINGS: Status post excision of the left breast. The radiofrequency tag is not within the specimen. However, the X shaped clip are present within the specimen. IMPRESSION: The radiofrequency tag is not within the specimen. Since the tag was immediately adjacent to the X shaped clip which is within the specimen, I suspect the tag fell out of the specimen. Recommend clinical correlation. Electronically Signed   By: Dorise Bullion III M.D.   On: 09/08/2021 12:59  MM LT RADIO FREQUENCY TAG LOC MAMMO GUIDE  Result Date: 09/05/2021 CLINICAL DATA:  41 year old female presenting for radiofrequency tag localization of the left breast. Patient was recently diagnosed with left breast ADH. EXAM:  MAMMOGRAPHIC GUIDED RADIOFREQUENCY DEVICE LOCALIZATION OF THE LEFT BREAST COMPARISON:  Previous exam(s) FINDINGS: Patient presents for radiofrequency device localization prior to left breast excision. I met with the patient and we discussed the procedure of radiofrequency device localization including benefits and alternatives. We discussed the high likelihood of a successful procedure. We discussed the risks of the procedure including infection, bleeding, tissue injury and further surgery. Informed, written consent was given. The usual time-out protocol was performed immediately prior to the procedure. Using mammographic guidance, sterile technique, 1% lidocaine as local anesthesia, a radiofrequency tag was used to localize the X shaped biopsy marking clip using a lateral approach. The follow-up mammogram images confirm that the RF device is in the expected location and are marked for Dr. Windell Moment. Follow-up survey of the patient confirms the presence of the RF device. The patient tolerated the procedure well and was released from the [facility]. IMPRESSION: Radiofrequency device localization of the LEFT breast. No apparent complications. Electronically Signed   By: Audie Pinto M.D.   On: 09/05/2021 16:40     ASSESSMENT & PLAN:  1. Atypical ductal hyperplasia of breast   2. Family history of cancer    #ADH Discussed with patient that atypical ductal hyperplasia is a proliferative lesion which is associated with increased risk of developing Breast cancer in the future. Future risk reduction recommendation was reviewed and discussed with patient. Active surveillance, recommend breast examination every 6 to 12 months and annual mammogram.  There is insufficient data to suggest MRI is beneficial in patient of average risk/intermediate risk. Patient has family history of breast cancer and we discussed about genetic testing.  She is not interested. I discussed about the option of chemoprevention  with tamoxifen for prevention.  Chemoprevention has not been found to confer a survival benefit.  Risk and benefits were reviewed and discussed with patient.  Patient declined. She prefers to continue follow-up with gynecology and have her gynecologist to perform breast examination and annual mammogram.  Patient will be discharged from our clinic. All questions were answered. The patient knows to call the clinic with any problems questions or concerns.  cc Herbert Pun, *    Thank you for this kind referral and the opportunity to participate in the care of this patient. A copy of today's note is routed to referring provider  Earlie Server, MD, PhD [facility] Hematology Oncology 09/27/2021

## 2021-10-18 ENCOUNTER — Inpatient Hospital Stay: Payer: Managed Care, Other (non HMO) | Attending: Oncology | Admitting: Hospice and Palliative Medicine

## 2021-10-18 DIAGNOSIS — N6099 Unspecified benign mammary dysplasia of unspecified breast: Secondary | ICD-10-CM

## 2021-10-18 NOTE — Progress Notes (Signed)
Multidisciplinary Oncology Council Documentation ? ?Maria Nixon was presented by our [facility] on 10/18/2021, which included representatives from:  ?Palliative Care ?Dietitian  ?Physical/Occupational Therapist ?Nurse Navigator ?Genetics ?Speech Therapist ?Social work ?Survivorship RN ?Financial Navigator ?Research RN ? ? ?Maria Nixon currently presents with history of atypical ductal hyperplasia of the breast.  ? ?We reviewed previous medical and familial history, history of present illness, and recent lab results along with all available histopathologic and imaging studies. The MOC considered available treatment options and made the following recommendations/referrals: ? ?Social work ? ?The MOC is a meeting of clinicians from various specialty areas who evaluate and discuss patients for whom a multidisciplinary approach is being considered. Final determinations in the plan of care are those of the provider(s).  ? ?Today's extended care, comprehensive team conference, Maria Nixon was not present for the discussion and was not examined.   ?

## 2021-10-25 ENCOUNTER — Encounter: Payer: Self-pay | Admitting: Licensed Clinical Social Worker

## 2021-10-26 NOTE — Progress Notes (Signed)
[facility] Clinical Social Work  ?Initial Assessment ? ? ?Maria Nixon is a 41 y.o. year old female contacted by phone. Clinical Social Work was referred by medical provider for assessment of psychosocial needs.  ? ?SDOH (Social Determinants of Health) assessments performed: Yes ?SDOH Interventions   ? ?Flowsheet Row Most Recent Value  ?SDOH Interventions   ?Food Insecurity Interventions Intervention Not Indicated  ?Financial Strain Interventions Intervention Not Indicated  ?Housing Interventions Intervention Not Indicated  ?Intimate Partner Violence Interventions Intervention Not Indicated  ?Physical Activity Interventions Intervention Not Indicated  ?Stress Interventions Intervention Not Indicated  ?Social Connections Interventions Intervention Not Indicated  ?Transportation Interventions Intervention Not Indicated  ? ?  ?  ?Distress Screen completed: No ? ?  09/27/2021  ?  9:31 AM  ?ONCBCN DISTRESS SCREENING  ?Screening Type Initial Screening  ?Distress experienced in past week (1-10) 0  ? ? ? ? ?Family/Social Information:  ?Housing Arrangement: patient lives with spouse Maria Nixon,Maria Nixon  ?[phone] (Home Phone) ?Family members/support persons in your life? Family and Friends/Colleagues ?Transportation concerns: no  ?Employment: Working full time. Income source: Employment ?Financial concerns: No ?Type of concern: None ?Food access concerns: no ?Religious or spiritual practice: no ?Services Currently in place:  [employer] ? ?Coping/ Adjustment to diagnosis: ?Patient understands treatment plan and what happens next? yes, but would like some clarification from Oncologist or RN Navigator.  CSW sent request to RN Navigator to contact patient. ?Concerns about diagnosis and/or treatment: I'm not especially worried about anything ?Patient reported stressors:  N/A ?Hopes and priorities: N/A ?Patient enjoys time with family/ friends ?Current coping skills/ strengths: Ability for insight , Average or above average intelligence ,  Capable of independent living , Communication skills , Financial means , General fund of knowledge , Motivation for treatment/growth , Physical Health , Supportive family/friends , and Work skills  ? ? ? SUMMARY: ?Current SDOH Barriers:  ?N/A ? ?Clinical Social Work Clinical Goal(s):  ?patient will follow up with [employer]* as directed by SW ? ?Interventions: ?Discussed common feeling and emotions when being diagnosed with cancer, and the importance of support during treatment ?Informed patient of the support team roles and support services at [facility] ?Provided CSW contact information and encouraged patient to call with any questions or concerns ?Referred patient to RN Navigator and Provided patient with information about CSW role in patient care and available resources. ? ? ?Follow Up Plan: Patient will contact CSW with any support or resource needs ?Patient verbalizes understanding of plan: Yes ? ? ? ?Murphy Duzan, LCSW ?

## 2022-03-23 ENCOUNTER — Other Ambulatory Visit: Payer: Self-pay | Admitting: Obstetrics and Gynecology

## 2022-03-23 DIAGNOSIS — N644 Mastodynia: Secondary | ICD-10-CM

## 2022-03-23 DIAGNOSIS — N6099 Unspecified benign mammary dysplasia of unspecified breast: Secondary | ICD-10-CM

## 2022-03-26 ENCOUNTER — Other Ambulatory Visit: Payer: Self-pay | Admitting: Obstetrics and Gynecology

## 2022-03-26 DIAGNOSIS — N644 Mastodynia: Secondary | ICD-10-CM

## 2022-04-12 ENCOUNTER — Other Ambulatory Visit: Payer: Self-pay | Admitting: Obstetrics and Gynecology

## 2022-04-12 ENCOUNTER — Ambulatory Visit
Admission: RE | Admit: 2022-04-12 | Discharge: 2022-04-12 | Disposition: A | Discharge status: Home / Self Care | Payer: Managed Care, Other (non HMO) | Source: Ambulatory Visit | Attending: Obstetrics and Gynecology | Admitting: Obstetrics and Gynecology

## 2022-04-12 DIAGNOSIS — N63 Unspecified lump in unspecified breast: Secondary | ICD-10-CM | POA: Insufficient documentation

## 2022-04-12 DIAGNOSIS — N644 Mastodynia: Secondary | ICD-10-CM

## 2022-04-20 ENCOUNTER — Other Ambulatory Visit: Payer: Self-pay | Admitting: Orthopedic Surgery

## 2022-04-20 DIAGNOSIS — R200 Anesthesia of skin: Secondary | ICD-10-CM

## 2022-04-30 ENCOUNTER — Other Ambulatory Visit: Payer: Self-pay | Admitting: Obstetrics and Gynecology

## 2022-04-30 DIAGNOSIS — N6099 Unspecified benign mammary dysplasia of unspecified breast: Secondary | ICD-10-CM

## 2022-05-17 ENCOUNTER — Ambulatory Visit
Admission: RE | Admit: 2022-05-17 | Discharge: 2022-05-17 | Disposition: A | Discharge status: Home / Self Care | Payer: Managed Care, Other (non HMO) | Source: Ambulatory Visit | Attending: Orthopedic Surgery | Admitting: Orthopedic Surgery

## 2022-05-17 DIAGNOSIS — R200 Anesthesia of skin: Secondary | ICD-10-CM

## 2022-05-17 MED ORDER — GADOBENATE DIMEGLUMINE 529 MG/ML IV SOLN
15.0000 mL | Freq: Once | INTRAVENOUS | Status: AC | PRN
  Administered 2022-05-17: 15 mL via INTRAVENOUS

## 2022-08-15 IMAGING — MG MM BREAST LOCALIZATION CLIP
4 series · 4 of 12 positions shown · non-contrast
Comparison: Previous exam(s).

CLINICAL DATA: Post procedure mammogram for clip placement.

EXAM:
3D DIAGNOSTIC LEFT MAMMOGRAM POST STEREOTACTIC BIOPSY

[L CC synth-2D]
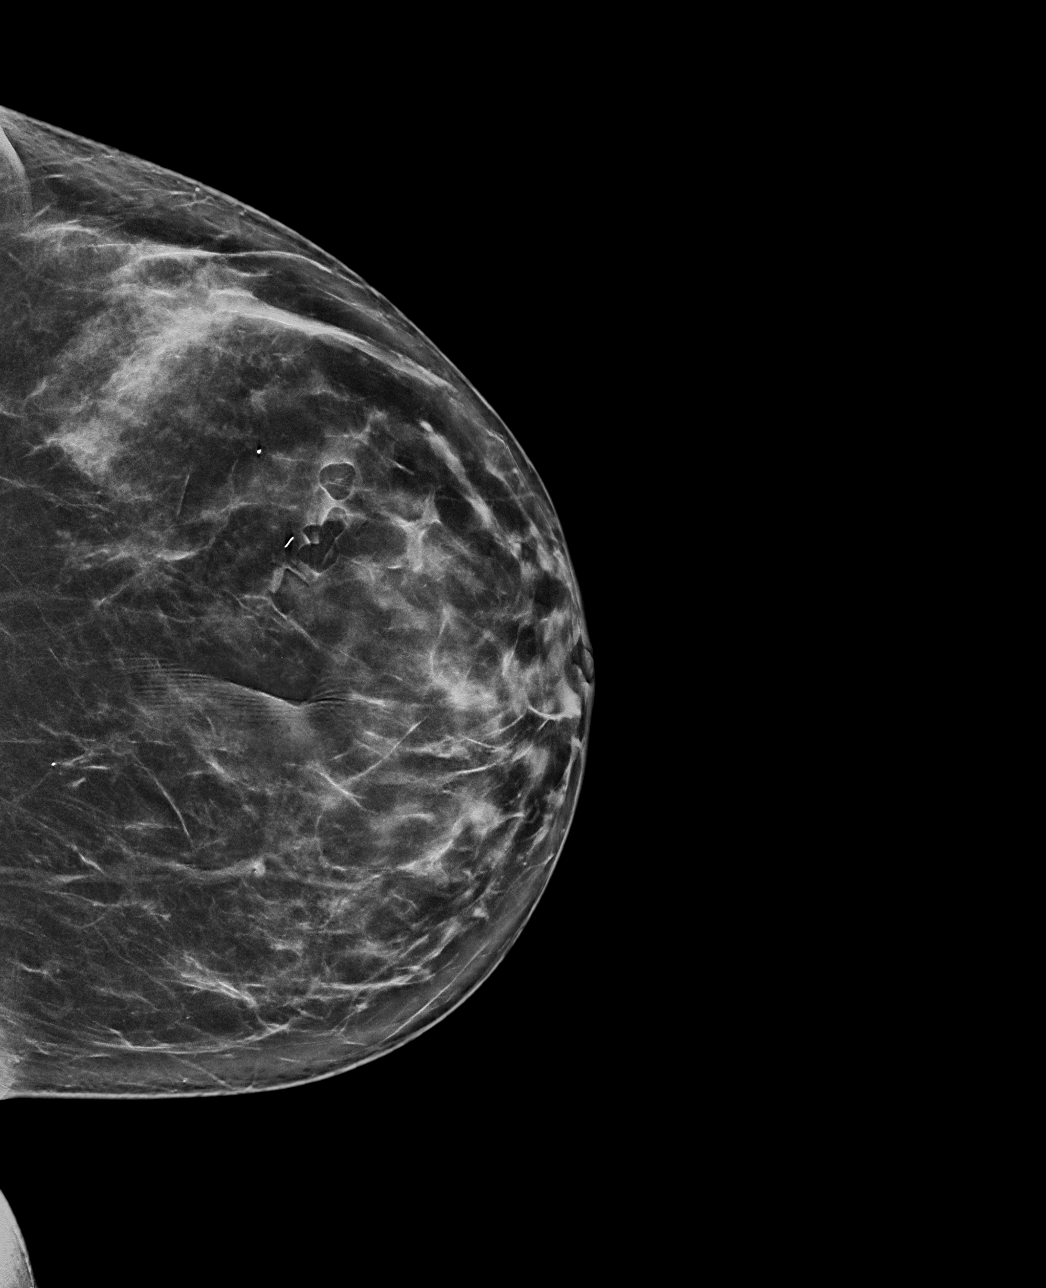

[L LM synth-2D]
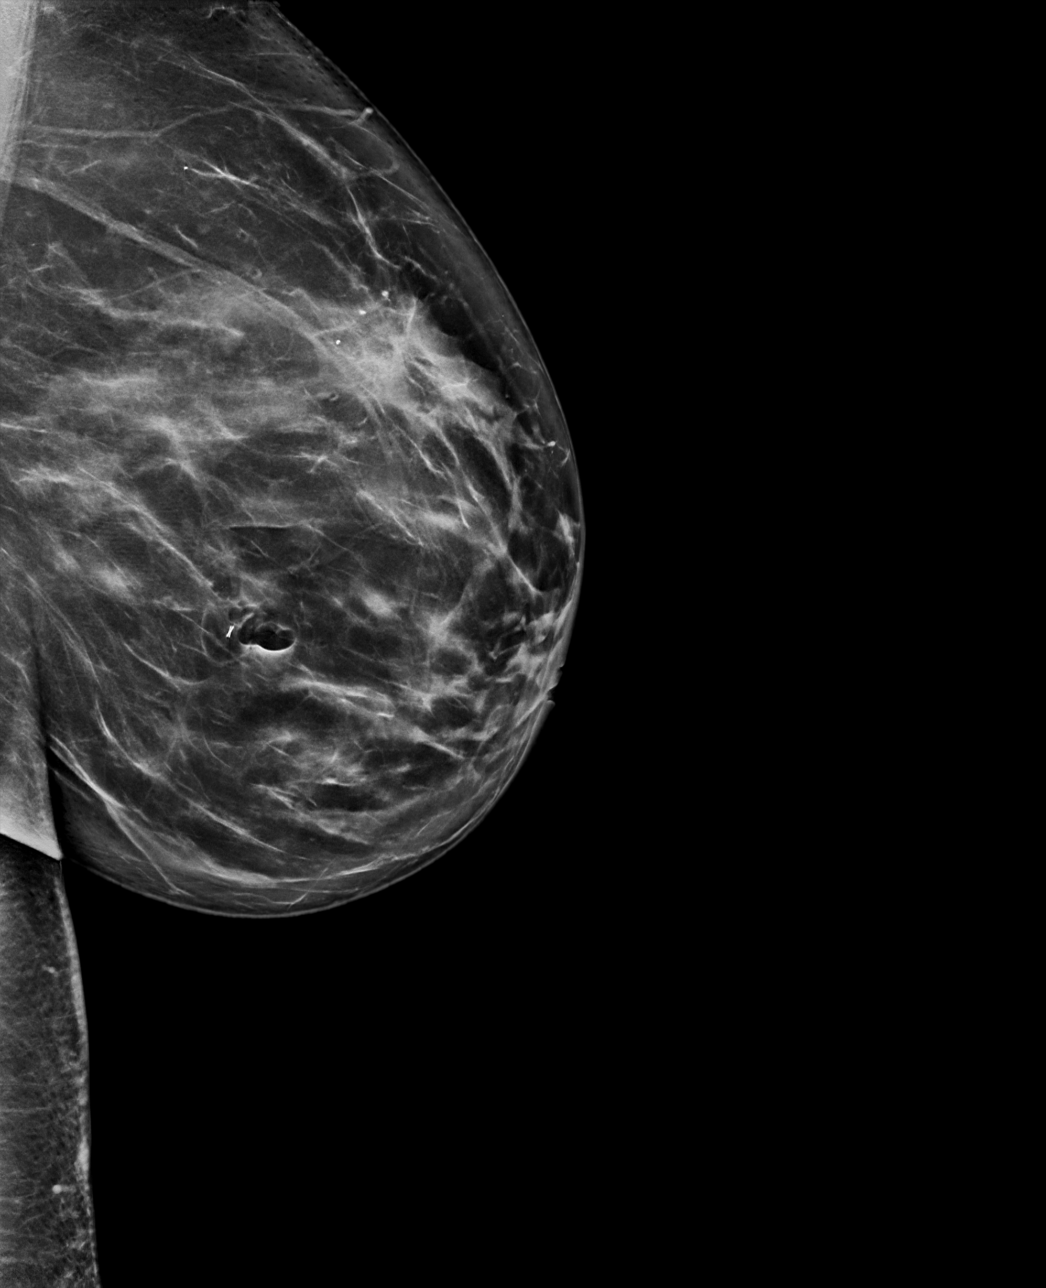

[L LM tomo · tomo slice 48/95.0]
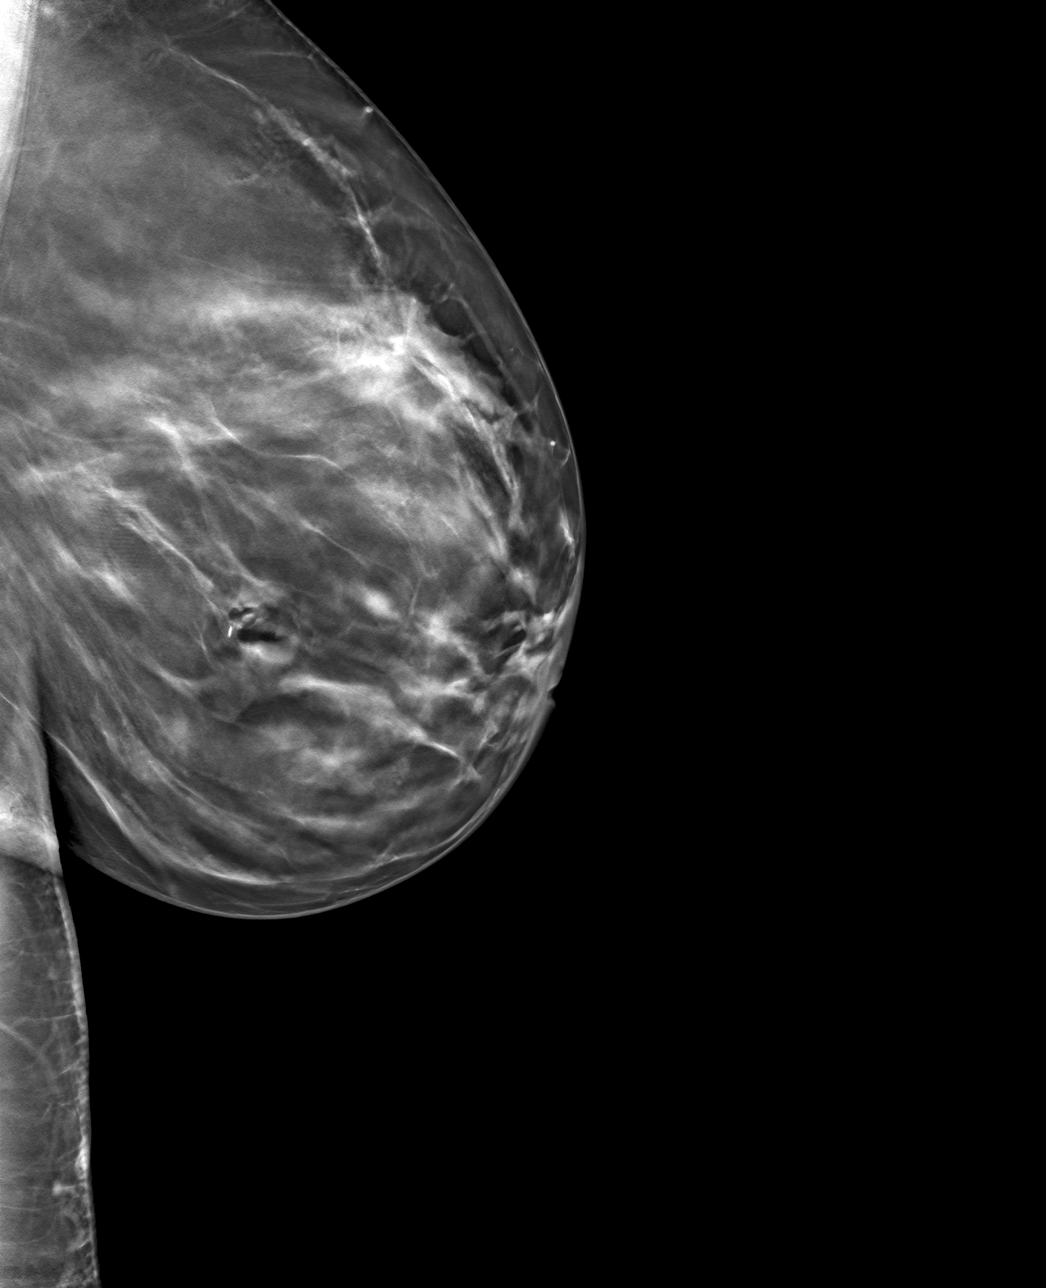

[L CC tomo · tomo slice 41/82.0]
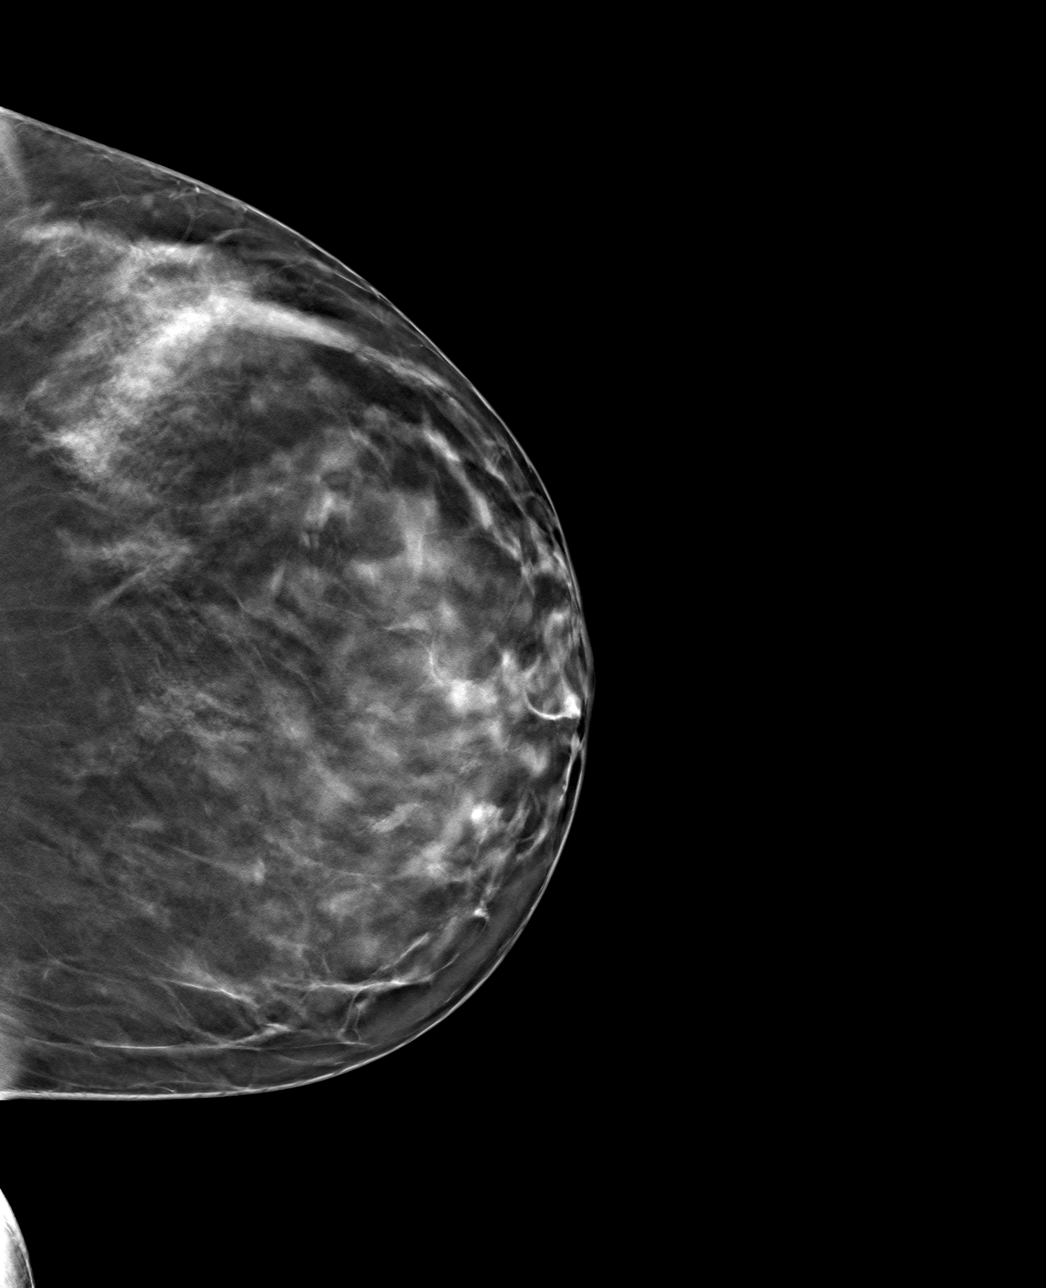

[4 of 12 positions shown; findings below may reference images not displayed]

FINDINGS: 3D Mammographic images were obtained following stereotactic guided
biopsy of a mass in the slightly outer left breast. The biopsy
marking clip is in expected position at the site of biopsy.
IMPRESSION: Appropriate positioning of the X shaped biopsy marking clip at the
site of biopsy in the slightly outer left breast.

Final Assessment: Post Procedure Mammograms for Marker Placement

## 2022-09-29 IMAGING — MG MM BREAST SURGICAL SPECIMEN
1 series · 1 of 1 positions shown · non-contrast
Comparison: Previous exam(s).

CLINICAL DATA: Evaluate specimen

EXAM:
SPECIMEN RADIOGRAPH OF THE LEFT BREAST

[L]
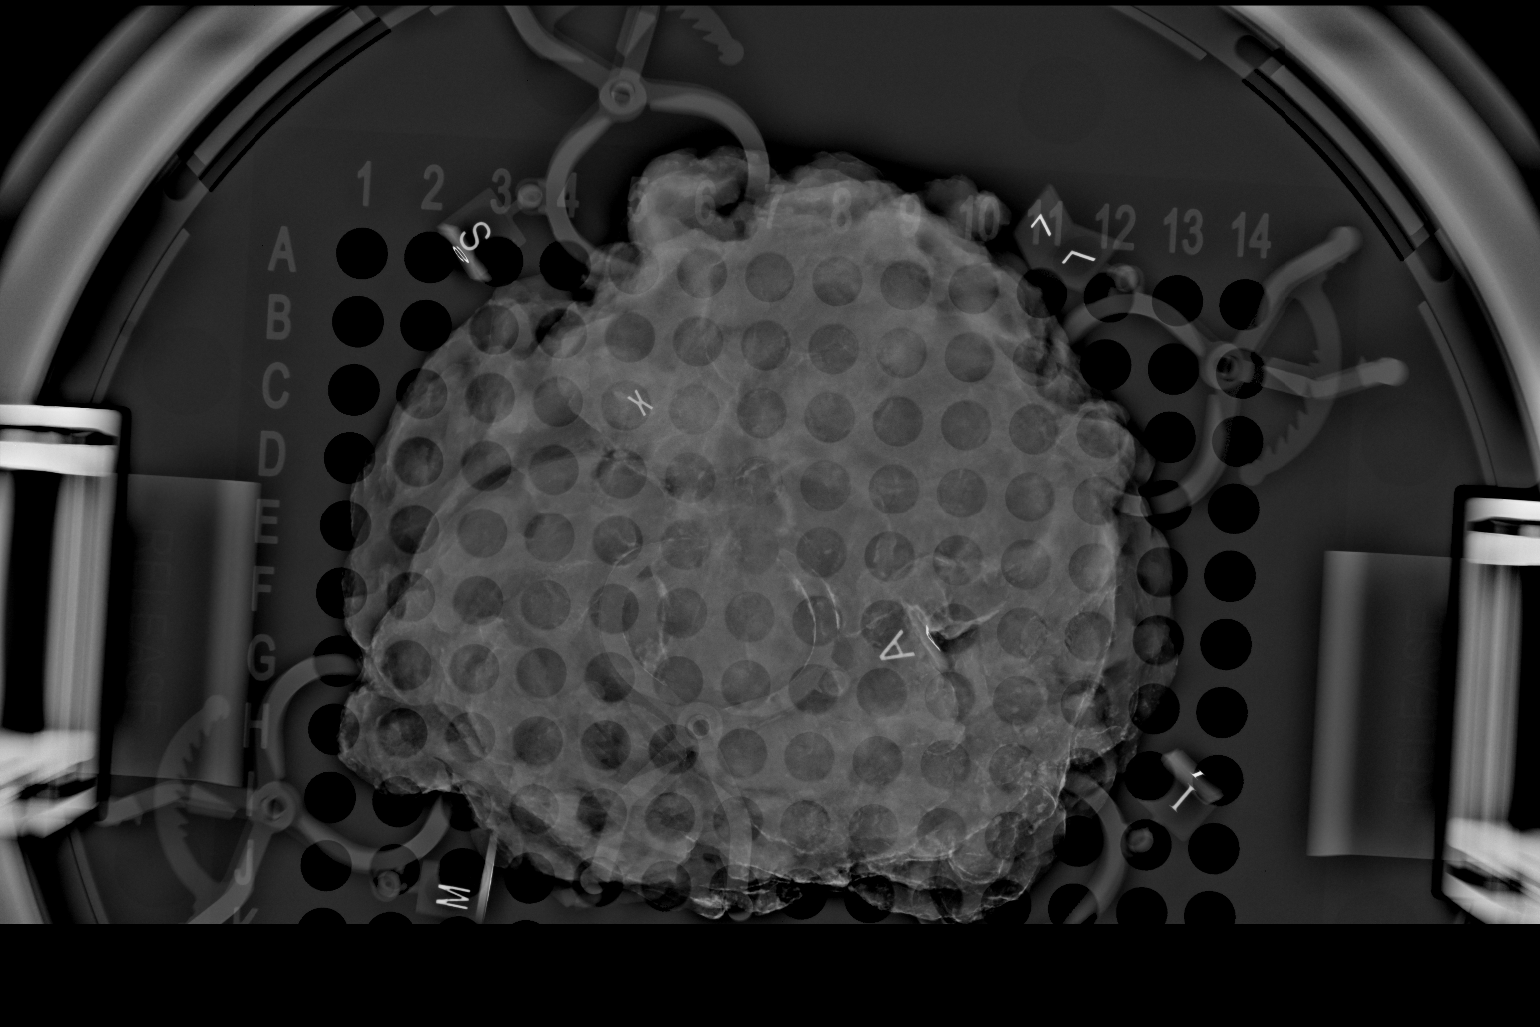

[1 of 1 positions shown; findings below may reference images not displayed]

FINDINGS: Status post excision of the left breast. The radiofrequency tag is
not within the specimen. However, the X shaped clip are present
within the specimen.
IMPRESSION: The radiofrequency tag is not within the specimen. Since the tag was
immediately adjacent to the X shaped clip which is within the
specimen, I suspect the tag fell out of the specimen. Recommend
clinical correlation.

## 2022-10-11 ENCOUNTER — Other Ambulatory Visit: Payer: Self-pay | Admitting: Obstetrics and Gynecology

## 2022-10-11 DIAGNOSIS — Z1231 Encounter for screening mammogram for malignant neoplasm of breast: Secondary | ICD-10-CM

## 2022-10-12 ENCOUNTER — Ambulatory Visit: Admission: RE | Admit: 2022-10-12 | Payer: Managed Care, Other (non HMO) | Source: Ambulatory Visit

## 2023-04-15 ENCOUNTER — Ambulatory Visit
Admission: RE | Admit: 2023-04-15 | Discharge: 2023-04-15 | Disposition: A | Discharge status: Home / Self Care | Payer: Managed Care, Other (non HMO) | Source: Ambulatory Visit | Attending: Obstetrics and Gynecology | Admitting: Obstetrics and Gynecology

## 2023-04-15 DIAGNOSIS — Z1231 Encounter for screening mammogram for malignant neoplasm of breast: Secondary | ICD-10-CM

## 2023-06-26 ENCOUNTER — Other Ambulatory Visit: Payer: Self-pay

## 2023-06-26 DIAGNOSIS — Z9189 Other specified personal risk factors, not elsewhere classified: Secondary | ICD-10-CM

## 2023-06-26 DIAGNOSIS — N6099 Unspecified benign mammary dysplasia of unspecified breast: Secondary | ICD-10-CM

## 2024-03-05 ENCOUNTER — Other Ambulatory Visit: Payer: Self-pay | Admitting: Infectious Diseases

## 2024-03-05 DIAGNOSIS — Z1231 Encounter for screening mammogram for malignant neoplasm of breast: Secondary | ICD-10-CM

## 2024-04-16 ENCOUNTER — Ambulatory Visit
Admission: RE | Admit: 2024-04-16 | Discharge: 2024-04-16 | Disposition: A | Discharge status: Home / Self Care | Source: Ambulatory Visit | Attending: Infectious Diseases | Admitting: Infectious Diseases

## 2024-04-16 DIAGNOSIS — Z1231 Encounter for screening mammogram for malignant neoplasm of breast: Secondary | ICD-10-CM | POA: Insufficient documentation
